# Patient Record
Sex: Male | Born: 1975 | Race: White | Hispanic: No | Marital: Single | Smoking: Never smoker
Health system: Southern US, Community
[De-identification: ages and names within clinical notes are randomized; demographics above are authoritative.]

## PROBLEM LIST (undated history)

## (undated) DIAGNOSIS — K8020 Calculus of gallbladder without cholecystitis without obstruction: Secondary | ICD-10-CM

## (undated) DIAGNOSIS — B019 Varicella without complication: Secondary | ICD-10-CM

## (undated) DIAGNOSIS — T7840XA Allergy, unspecified, initial encounter: Secondary | ICD-10-CM

## (undated) DIAGNOSIS — Q909 Down syndrome, unspecified: Secondary | ICD-10-CM

## (undated) DIAGNOSIS — Z923 Personal history of irradiation: Secondary | ICD-10-CM

## (undated) DIAGNOSIS — Z87448 Personal history of other diseases of urinary system: Secondary | ICD-10-CM

## (undated) DIAGNOSIS — C801 Malignant (primary) neoplasm, unspecified: Secondary | ICD-10-CM

## (undated) DIAGNOSIS — Z8489 Family history of other specified conditions: Secondary | ICD-10-CM

## (undated) HISTORY — DX: Down syndrome, unspecified: Q90.9

## (undated) HISTORY — DX: Allergy, unspecified, initial encounter: T78.40XA

## (undated) HISTORY — DX: Varicella without complication: B01.9

## (undated) HISTORY — DX: Calculus of gallbladder without cholecystitis without obstruction: K80.20

## (undated) HISTORY — PX: ROOT CANAL: SHX2363

---

## 2010-04-07 ENCOUNTER — Ambulatory Visit
Admission: RE | Admit: 2010-04-07 | Discharge: 2010-04-07 | Disposition: A | Discharge status: Home / Self Care | Payer: Medicare Other | Source: Ambulatory Visit | Attending: Emergency Medicine | Admitting: Emergency Medicine

## 2010-04-07 ENCOUNTER — Other Ambulatory Visit: Payer: Self-pay | Admitting: Emergency Medicine

## 2011-02-08 ENCOUNTER — Encounter (INDEPENDENT_AMBULATORY_CARE_PROVIDER_SITE_OTHER): Payer: Medicare Other | Admitting: Emergency Medicine

## 2011-02-08 DIAGNOSIS — Z23 Encounter for immunization: Secondary | ICD-10-CM

## 2011-02-08 DIAGNOSIS — Z0000 Encounter for general adult medical examination without abnormal findings: Secondary | ICD-10-CM

## 2011-02-08 DIAGNOSIS — H902 Conductive hearing loss, unspecified: Secondary | ICD-10-CM

## 2011-05-09 ENCOUNTER — Encounter: Payer: Self-pay | Admitting: *Deleted

## 2011-05-09 DIAGNOSIS — Q909 Down syndrome, unspecified: Secondary | ICD-10-CM

## 2011-05-09 DIAGNOSIS — R310 Gross hematuria: Secondary | ICD-10-CM | POA: Insufficient documentation

## 2011-05-10 ENCOUNTER — Encounter: Payer: Self-pay | Admitting: Emergency Medicine

## 2011-05-10 ENCOUNTER — Ambulatory Visit (INDEPENDENT_AMBULATORY_CARE_PROVIDER_SITE_OTHER): Payer: Medicare Other | Admitting: Emergency Medicine

## 2011-05-10 VITALS — BP 113/68 | HR 82 | Temp 98.6°F | Resp 16 | Ht 60.0 in | Wt 153.8 lb

## 2011-05-10 DIAGNOSIS — E782 Mixed hyperlipidemia: Secondary | ICD-10-CM

## 2011-05-10 DIAGNOSIS — E668 Other obesity: Secondary | ICD-10-CM

## 2011-05-10 DIAGNOSIS — E785 Hyperlipidemia, unspecified: Secondary | ICD-10-CM

## 2011-05-10 DIAGNOSIS — E236 Other disorders of pituitary gland: Secondary | ICD-10-CM

## 2011-05-10 DIAGNOSIS — Q909 Down syndrome, unspecified: Secondary | ICD-10-CM

## 2011-05-10 NOTE — Progress Notes (Signed)
  Subjective:    Patient ID: Zachary Wheeler, male    DOB: Oct 24, 1975, 36 y.o.   MRN: [id number]  HPI patient here to recheck his triglycerides and his testosterone level. 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 He really has no symptoms of low testosterone. He does not have any fatigue. We did not discuss the issue of erections in that may not be prudent to encourage him to be sexually active.    Review of Systems  Constitutional: Negative.   HENT: Negative.   Eyes: Negative.   Respiratory: Negative.   Cardiovascular: Negative.   Gastrointestinal: Negative.   Genitourinary:       Does have a history of urinary tract infections but is really had no symptoms recently.       Objective:   Physical Exam  Constitutional:       Patient has Down's appearance.  HENT:  Right Ear: External ear normal.  Left Ear: External ear normal.  Neck: No thyromegaly present.  Cardiovascular: Normal rate, regular rhythm and normal heart sounds.   Pulmonary/Chest: No respiratory distress. He has no wheezes.          Assessment & Plan:  We'll recheck his triglycerides and HDL today. He may be a candidate to try niacin. Will hold off on treatment of his low testosterone at the present time in that treatment may create more problems than it helps.

## 2011-05-11 LAB — CBC WITH DIFFERENTIAL/PLATELET
Eosinophils Absolute: 0.1 10*3/uL (ref 0.0–0.7)
Eosinophils Relative: 1 % (ref 0–5)
Hemoglobin: 15.5 g/dL (ref 13.0–17.0)
Lymphocytes Relative: 41 % (ref 12–46)
Lymphs Abs: 2 10*3/uL (ref 0.7–4.0)
MCH: 31.8 pg (ref 26.0–34.0)
MCV: 96.7 fL (ref 78.0–100.0)
Monocytes Relative: 12 % (ref 3–12)
Platelets: 219 10*3/uL (ref 150–400)
RBC: 4.88 MIL/uL (ref 4.22–5.81)
WBC: 4.8 10*3/uL (ref 4.0–10.5)

## 2011-05-11 LAB — TESTOSTERONE, FREE, TOTAL, SHBG
Testosterone, Free: 71.5 pg/mL (ref 47.0–244.0)
Testosterone-% Free: 2.4 % (ref 1.6–2.9)
Testosterone: 293.8 ng/dL (ref 250–890)

## 2011-05-11 LAB — LIPID PANEL
Cholesterol: 190 mg/dL (ref 0–200)
Total CHOL/HDL Ratio: 5 Ratio

## 2011-05-12 ENCOUNTER — Encounter: Payer: Self-pay | Admitting: Radiology

## 2011-09-26 ENCOUNTER — Ambulatory Visit (INDEPENDENT_AMBULATORY_CARE_PROVIDER_SITE_OTHER): Payer: MEDICARE | Admitting: Family Medicine

## 2011-09-26 VITALS — BP 112/64 | HR 72 | Temp 98.0°F | Resp 18 | Ht 60.0 in | Wt 153.0 lb

## 2011-09-26 DIAGNOSIS — J029 Acute pharyngitis, unspecified: Secondary | ICD-10-CM

## 2011-09-26 DIAGNOSIS — R059 Cough, unspecified: Secondary | ICD-10-CM

## 2011-09-26 DIAGNOSIS — R05 Cough: Secondary | ICD-10-CM

## 2011-09-26 DIAGNOSIS — J40 Bronchitis, not specified as acute or chronic: Secondary | ICD-10-CM

## 2011-09-26 DIAGNOSIS — R509 Fever, unspecified: Secondary | ICD-10-CM

## 2011-09-26 MED ORDER — AZITHROMYCIN 250 MG PO TABS: ORAL_TABLET | ORAL | Status: AC

## 2011-09-26 NOTE — Progress Notes (Signed)
@[email or web address] @   Patient ID: Zachary Wheeler MRN: [id number], DOB: [birth year], 36 y.o. Date of Encounter: 09/26/2011, 3:13 PM  Primary Physician: Lucilla Edin, MD  Chief Complaint:  Chief Complaint  Patient presents with  . Cough    since thursday  . Generalized Body Aches  . Sore Throat    HPI: 36 y.o. year old male presents with 4 day history of nasal congestion, post nasal drip, sore throat, sinus pressure, and cough. Afebrile. No chills. Nasal congestion thick and green/yellow. Sinus pressure is the worst symptom. Cough is productive secondary to post nasal drip and not associated with time of day. Ears feel full, leading to sensation of muffled hearing. Has tried OTC cold preps without success. No GI complaints. Appetite good  No recent antibiotics, recent travels, or sick contacts   No leg trauma, sedentary periods, h/o cancer, or tobacco use.  Past Medical History  Diagnosis Date  . Trisomy 21   . Gallstones   . Hematuria      Home Meds: Prior to Admission medications   Medication Sig Start Date End Date Taking? Authorizing Provider  ibuprofen (ADVIL,MOTRIN) 200 MG tablet Take 200 mg by mouth every 6 (six) hours as needed.   Yes Historical Provider, MD  pseudoephedrine-guaifenesin (MUCINEX D) 60-600 MG per tablet Take 1 tablet by mouth every 12 (twelve) hours.   Yes Historical Provider, MD  vitamin C (ASCORBIC ACID) 500 MG tablet Take 500 mg by mouth daily.    Historical Provider, MD    Allergies:  Allergies  Allergen Reactions  . Codeine   . Sulfa Antibiotics   . Terbinafine Hcl     History   Social History  . Marital Status: Single    Spouse Name: N/A    Number of Children: N/A  . Years of Education: 12   Occupational History  . janitor    Social History Main Topics  . Smoking status: Never Smoker   . Smokeless tobacco: Not on file  . Alcohol Use: Yes     1 beer on occas  . Drug Use: No  . Sexually Active: Not on file   Other Topics Concern  . Not on  file   Social History Narrative   Exercises regularly.     Review of Systems: Constitutional: negative for chills, fever, night sweats or weight changes Cardiovascular: negative for chest pain or palpitations Respiratory: negative for hemoptysis, wheezing, or shortness of breath Abdominal: negative for abdominal pain, nausea, vomiting or diarrhea Dermatological: negative for rash Neurologic: negative for headache   Physical Exam: Blood pressure 112/64, pulse 72, temperature 98 F (36.7 C), temperature source Oral, resp. rate 18, height 5' (1.524 m), weight 153 lb (69.4 kg), SpO2 97.00%., Body mass index is 29.88 kg/(m^2). General: Well developed, well nourished, in no acute distress. Head: Normocephalic, atraumatic, eyes without discharge, sclera non-icteric, nares are congested. Bilateral auditory canals clear, TM's are without perforation, pearly grey with reflective cone of light bilaterally. Serous effusion bilaterally behind TM's. Maxillary sinus TTP. Oral cavity moist, dentition normal. Posterior pharynx with post nasal drip and mild erythema. No peritonsillar abscess or tonsillar exudate. Neck: Supple. No thyromegaly. Full ROM. No lymphadenopathy. Lungs: Clear bilaterally to auscultation without wheezes, rales, or rhonchi. Breathing is unlabored.  Heart: RRR with S1 S2. No murmurs, rubs, or gallops appreciated. Msk:  Strength and tone normal for age. Extremities: No clubbing or cyanosis. No edema. Neuro: Alert and oriented X 3. Moves all extremities spontaneously. CNII-XII grossly in tact. Psych:  Responds to questions appropriately with a normal affect.   Labs:   ASSESSMENT AND PLAN:  36 y.o. year old male with bronchitis 1. Bronchitis  azithromycin (ZITHROMAX Z-PAK) 250 MG tablet    -  -Tylenol/Motrin prn -Rest/fluids -RTC precautions -RTC 3-5 days if no improvement  Signed, Elvina Sidle, MD 09/26/2011 3:13 PM  \

## 2011-11-10 ENCOUNTER — Ambulatory Visit (INDEPENDENT_AMBULATORY_CARE_PROVIDER_SITE_OTHER): Payer: MEDICARE

## 2011-11-10 DIAGNOSIS — Z23 Encounter for immunization: Secondary | ICD-10-CM

## 2011-11-10 NOTE — Progress Notes (Signed)
  Subjective:    Patient ID: Zachary Wheeler, male    DOB: [birth year], 36 y.o.   MRN: [id number]  HPI    Review of Systems     Objective:   Physical Exam        Assessment & Plan:  PT HERE FOR FLU SHOT ONLY

## 2011-11-22 ENCOUNTER — Ambulatory Visit (INDEPENDENT_AMBULATORY_CARE_PROVIDER_SITE_OTHER): Payer: MEDICARE | Admitting: Family Medicine

## 2011-11-22 VITALS — BP 108/64 | HR 88 | Temp 98.0°F | Resp 16 | Ht 60.0 in | Wt 151.4 lb

## 2011-11-22 DIAGNOSIS — H6690 Otitis media, unspecified, unspecified ear: Secondary | ICD-10-CM

## 2011-11-22 DIAGNOSIS — H6120 Impacted cerumen, unspecified ear: Secondary | ICD-10-CM

## 2011-11-22 MED ORDER — AMOXICILLIN-POT CLAVULANATE 875-125 MG PO TABS: 1.0000 | ORAL_TABLET | Freq: Two times a day (BID) | ORAL | Status: DC

## 2011-11-22 NOTE — Progress Notes (Signed)
  Subjective:    Patient ID: Zachary Wheeler, male    DOB: 1975/06/21, 36 y.o.   MRN: [id number]  HPI  Pt has had a persistent cough ever since 2 mos prev when he was treated w/ a zpack for bronchitis.  He felt much better after the abx but the cough never went away.  Productive of green sputum.  No f/c. No SHoB or CP.  Has been taking some mucinex to try to loosen up the congestion.  For the past few days his ears have been hurting w/ right > left.      Review of Systems  Constitutional: Negative for fever, chills, diaphoresis and fatigue.  HENT: Negative for congestion, sore throat, rhinorrhea, sneezing, neck stiffness, postnasal drip and sinus pressure.   Respiratory: Positive for cough. Negative for chest tightness, shortness of breath and wheezing.   Cardiovascular: Negative for chest pain.  Gastrointestinal: Negative for nausea and abdominal pain.  Skin: Negative for rash.  Neurological: Negative for dizziness, light-headedness and headaches.       Objective:   Physical Exam  Constitutional: He appears well-developed and well-nourished. No distress.  HENT:  Head: Normocephalic and atraumatic.  Right Ear: External ear normal. Tympanic membrane is retracted. Tympanic membrane is not perforated.  Left Ear: External ear normal. No drainage. Tympanic membrane is injected, erythematous and bulging. Tympanic membrane is not perforated. A middle ear effusion is present.  Nose: Nose normal.  Mouth/Throat: Oropharynx is clear and moist. No oropharyngeal exudate.       Cerumen removed with curette bilaterally necessary for visualization of TM.  Eyes: Conjunctivae normal are normal. No scleral icterus.  Neck: Normal range of motion. Neck supple. No thyromegaly present.  Cardiovascular: Normal rate, regular rhythm, normal heart sounds and intact distal pulses.   Pulmonary/Chest: Effort normal and breath sounds normal. No respiratory distress.  Lymphadenopathy:    He has no cervical adenopathy.    Neurological: He is alert.  Skin: Skin is warm and dry. He is not diaphoretic.  Psychiatric: He has a normal mood and affect.          Assessment & Plan:  1.  Lt OM - augmentin 2. Cough - hopefully will resolve w/ above trx. If cont, rtc for eval of allergies, asthma, lrd as cause.

## 2012-04-13 ENCOUNTER — Ambulatory Visit (INDEPENDENT_AMBULATORY_CARE_PROVIDER_SITE_OTHER): Payer: MEDICARE | Admitting: Internal Medicine

## 2012-04-13 VITALS — BP 118/74 | HR 94 | Temp 98.7°F | Resp 16 | Ht 60.0 in | Wt 157.2 lb

## 2012-04-13 DIAGNOSIS — J039 Acute tonsillitis, unspecified: Secondary | ICD-10-CM

## 2012-04-13 MED ORDER — AZITHROMYCIN 250 MG PO TABS: ORAL_TABLET | ORAL | Status: DC

## 2012-04-13 NOTE — Progress Notes (Signed)
  Subjective:    Patient ID: Zachary Wheeler, male    DOB: [birth year], 37 y.o.   MRN: [id number]  HPI Patient comes in today with with his mother for concerns that he has not been able to keep anything down for about a week. He has a sore throat that seems to be getting "clogged up". He then feels nauseated. He did have some ginger ale this around noon. He did not sleep at all last night. He has not had a fever. Last night he was laying on his stomach and flipped over to his back. He felt clammy and a burning sensation in his chest. Still has the burning sensation it is just mild now in comparison. He does complain of a headache but it is minor. He has some sinus pressure under his eyes.   Diarrhea has been a big concern also. He was using sugar free cough drops in excess for the past few weeks according to his mother. He was taking multiple over a few hour period. He has not had diarrhea since this morning.    Review of Systems     Objective:   Physical Exam BP 118/74  Pulse 94  Temp(Src) 98.7 F (37.1 C) (Oral)  Resp 16  Ht 5' (1.524 m)  Wt 157 lb 3.2 oz (71.305 kg)  BMI 30.7 kg/m2  SpO2 97% Narrow throat/obese neck Enlarged tonsils with exudate Tender 1+ a.c. Nodes Chest clear      Assessment & Plan:  Acute tonsillitis - Plan: Culture, Group A Strep, azithromycin (ZITHROMAX) 250 MG tablet

## 2012-04-15 LAB — CULTURE, GROUP A STREP: Organism ID, Bacteria: NORMAL

## 2012-11-13 ENCOUNTER — Ambulatory Visit (INDEPENDENT_AMBULATORY_CARE_PROVIDER_SITE_OTHER): Payer: MEDICARE | Admitting: *Deleted

## 2012-11-13 DIAGNOSIS — Z23 Encounter for immunization: Secondary | ICD-10-CM

## 2013-02-10 ENCOUNTER — Ambulatory Visit (INDEPENDENT_AMBULATORY_CARE_PROVIDER_SITE_OTHER): Payer: MEDICARE | Admitting: Emergency Medicine

## 2013-02-10 VITALS — BP 110/68 | HR 89 | Temp 98.2°F | Resp 18 | Ht 60.0 in | Wt 155.0 lb

## 2013-02-10 DIAGNOSIS — J029 Acute pharyngitis, unspecified: Secondary | ICD-10-CM

## 2013-02-10 DIAGNOSIS — M791 Myalgia, unspecified site: Secondary | ICD-10-CM

## 2013-02-10 DIAGNOSIS — R319 Hematuria, unspecified: Secondary | ICD-10-CM

## 2013-02-10 DIAGNOSIS — IMO0001 Reserved for inherently not codable concepts without codable children: Secondary | ICD-10-CM

## 2013-02-10 DIAGNOSIS — R05 Cough: Secondary | ICD-10-CM

## 2013-02-10 DIAGNOSIS — M549 Dorsalgia, unspecified: Secondary | ICD-10-CM

## 2013-02-10 DIAGNOSIS — R509 Fever, unspecified: Secondary | ICD-10-CM

## 2013-02-10 LAB — POCT UA - MICROSCOPIC ONLY
Casts, Ur, LPF, POC: NEGATIVE
Yeast, UA: NEGATIVE

## 2013-02-10 LAB — POCT URINALYSIS DIPSTICK
Leukocytes, UA: NEGATIVE
Protein, UA: 30
Spec Grav, UA: 1.02
Urobilinogen, UA: 0.2

## 2013-02-10 LAB — POCT INFLUENZA A/B: Influenza A, POC: NEGATIVE

## 2013-02-10 MED ORDER — BENZONATATE 100 MG PO CAPS: 100.0000 mg | ORAL_CAPSULE | Freq: Three times a day (TID) | ORAL | Status: DC | PRN

## 2013-02-10 NOTE — Patient Instructions (Signed)
Recheck Fri 02/15/2013 (fast pass after triage) to see Dr. Cleta Alberts, recheck urine for hematuria

## 2013-02-10 NOTE — Progress Notes (Addendum)
Subjective:    Patient ID: Zachary Wheeler, male    DOB: February 19, 1976, 37 y.o.   MRN: [id number] This chart was scribed for Lesle Chris, MD by Nicholos Johns, Medical Scribe. This patient's care was started at 8:29 AM.  HPI  HPI Comments: Zachary Wheeler is a 37 y.o. male who presents to the [facility] complaining of gradually worsening, severe back pain, onset 6 days ago. Patient states he has since started experiencing aching from head to toe with associated subjective fever, sore throat, cough, and headache. He states the sore throat has resolved slightly but may return upon coughing. Cough is still present. Pt denies any urinary trouble since his last episode 3 years ago.  Review of Systems  Constitutional: Positive for fever.  HENT: Positive for sore throat.   Respiratory: Positive for cough.   Neurological: Positive for headaches.       Objective:   Physical Exam  Vitals reviewed.  CONSTITUTIONAL: Well developed/well nourished HEAD AND FACE: Normocephalic/atraumatic EYES: EOMI ENMT: Mucous membranes moist patient does have significant nasal congestion. His throat is slightly red NECK: supple no meningeal signs SPINE:entire spine nontender CV: S1/S2 noted, no murmurs/rubs/gallops noted LUNGS: Lungs are clear to auscultation bilaterally, no apparent distress no rales or wheezes were heard. ABDOMEN: soft, nontender, no rebound or guarding GU:no cva tenderness NEURO: Pt is awake/alert, moves all extremitiesx4 EXTREMITIES: pulses normal, full ROM SKIN: warm, color normal PSYCH: no abnormalities of mood noted    Filed Vitals:   02/10/13 0819  BP: 110/68  Pulse: 89  Temp: 98.2 F (36.8 C)  TempSrc: Oral  Resp: 18  Height: 5' (1.524 m)  Weight: 155 lb (70.308 kg)  SpO2: 97%   Results for orders placed in visit on 02/10/13  POCT URINALYSIS DIPSTICK      Result Value Range   Color, UA yellow     Clarity, UA clear     Glucose, UA neg     Bilirubin,  UA neg     Ketones, UA neg     Spec Grav, UA 1.020     Blood, UA moderate     pH, UA 6.0     Protein, UA 30     Urobilinogen, UA 0.2     Nitrite, UA neg     Leukocytes, UA Negative    POCT UA - MICROSCOPIC ONLY      Result Value Range   WBC, Ur, HPF, POC 0-2     RBC, urine, microscopic 10-15     Bacteria, U Microscopic trace     Mucus, UA neg     Epithelial cells, urine per micros 0-1     Crystals, Ur, HPF, POC neg     Casts, Ur, LPF, POC neg     Yeast, UA neg    POCT INFLUENZA A/B      Result Value Range   Influenza A, POC Negative     Influenza B, POC Negative    POCT RAPID STREP A (OFFICE)      Result Value Range   Rapid Strep A Screen Negative  Negative        Assessment & Plan:  Testing is all negative. We'll treat with fluids, Tylenol, Tessalon Perles . 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     I personally performed the  services described in this documentation, which was scribed in my presence. The recorded information has been reviewed and is accurate.

## 2013-02-11 LAB — URINE CULTURE: Colony Count: NO GROWTH

## 2013-02-16 ENCOUNTER — Ambulatory Visit (INDEPENDENT_AMBULATORY_CARE_PROVIDER_SITE_OTHER): Payer: MEDICARE | Admitting: Emergency Medicine

## 2013-02-16 VITALS — BP 112/62 | HR 76 | Temp 97.6°F | Resp 16 | Ht 61.0 in | Wt 155.0 lb

## 2013-02-16 DIAGNOSIS — R319 Hematuria, unspecified: Secondary | ICD-10-CM

## 2013-02-16 DIAGNOSIS — R05 Cough: Secondary | ICD-10-CM

## 2013-02-16 DIAGNOSIS — R059 Cough, unspecified: Secondary | ICD-10-CM

## 2013-02-16 LAB — POCT URINALYSIS DIPSTICK
Glucose, UA: NEGATIVE
Leukocytes, UA: NEGATIVE
Protein, UA: 30
Spec Grav, UA: 1.02
Urobilinogen, UA: 0.2
pH, UA: 6

## 2013-02-16 LAB — POCT UA - MICROSCOPIC ONLY
Casts, Ur, LPF, POC: NEGATIVE
Crystals, Ur, HPF, POC: NEGATIVE

## 2013-02-16 NOTE — Progress Notes (Signed)
Subjective:    Patient ID: Zachary Wheeler, male    DOB: 1976-01-07, 37 y.o.   MRN: [id number]  HPI  This chart was scribed for Collene Gobble, MD, by Ellin Mayhew, ED Scribe. This patient was seen in room 04 and the patient's care was started at 8:56 AM.  HPI Comments: Zachary Wheeler is a 37 y.o. male who presents to the [facility] for a follow up appointment regarding a productive cough that began six days ago. Patient reports feeling better; however, is still producing phlegm upon coughing. He was last seen six days ago by Dr. Cleta Alberts at the [facility] center for his cough. He has taken ibuprofen and mucinex, with no relief of his cough or congestion. He denies any related myalgias and fever that accompanied his cough six days ago. Patient appears generally healthy and denies any other issues currently.   Past Medical History  Diagnosis Date  . Trisomy 21   . Gallstones   . Hematuria   . Allergy   . Chronic kidney disease   . Down syndrome     Past Surgical History  Procedure Laterality Date  . Root canal      Family History  Problem Relation Age of Onset  . Hypertension Mother   . Hyperlipidemia Mother   . Hypertension Maternal Grandmother   . Emphysema Maternal Grandmother   . Hypertension Maternal Grandfather   . Alcohol abuse Maternal Grandfather   . Cancer Paternal Grandmother   . Congestive Heart Failure Paternal Grandfather     History   Social History  . Marital Status: Single    Spouse Name: N/A    Number of Children: N/A  . Years of Education: 12   Occupational History  . janitor    Social History Main Topics  . Smoking status: Never Smoker   . Smokeless tobacco: Not on file  . Alcohol Use: Yes     Comment: 1 beer on occas  . Drug Use: No  . Sexual Activity: Not on file   Other Topics Concern  . Not on file   Social History Narrative   Exercises regularly.    Allergies  Allergen Reactions  . Codeine   .  Sulfa Antibiotics   . Terbinafine Hcl     Patient Active Problem List   Diagnosis Date Noted  . Trisomy 21   . Hematuria     Results for orders placed in visit on 02/10/13  URINE CULTURE      Result Value Range   Colony Count NO GROWTH     Organism ID, Bacteria NO GROWTH    POCT URINALYSIS DIPSTICK      Result Value Range   Color, UA yellow     Clarity, UA clear     Glucose, UA neg     Bilirubin, UA neg     Ketones, UA neg     Spec Grav, UA 1.020     Blood, UA moderate     pH, UA 6.0     Protein, UA 30     Urobilinogen, UA 0.2     Nitrite, UA neg     Leukocytes, UA Negative    POCT UA - MICROSCOPIC ONLY      Result Value Range   WBC, Ur, HPF, POC 0-2     RBC, urine, microscopic 10-15     Bacteria, U Microscopic trace     Mucus, UA neg  Epithelial cells, urine per micros 0-1     Crystals, Ur, HPF, POC neg     Casts, Ur, LPF, POC neg     Yeast, UA neg    POCT INFLUENZA A/B      Result Value Range   Influenza A, POC Negative     Influenza B, POC Negative    POCT RAPID STREP A (OFFICE)      Result Value Range   Rapid Strep A Screen Negative  Negative    1. Hematuria     No orders of the defined types were placed in this encounter.    BP 112/62  Pulse 76  Temp(Src) 97.6 F (36.4 C) (Oral)  Resp 16  Ht 5\' 1"  (1.549 m)  Wt 155 lb (70.308 kg)  BMI 29.30 kg/m2  SpO2 99%   Review of Systems  Constitutional: Negative for fever, chills, activity change and appetite change.  HENT: Positive for congestion. Negative for ear pain and sore throat.   Respiratory: Positive for cough. Negative for shortness of breath and wheezing.   Gastrointestinal: Negative for nausea, vomiting, abdominal pain and diarrhea.  Skin: Negative for color change and rash.  Neurological: Negative for dizziness and headaches.   A complete 10 system review of systems was obtained and all systems are negative except as noted in the HPI and PMH.    Objective:   Physical  Exam  CONSTITUTIONAL: Well nourished. NAD. HEAD: Atraumatic. EYES: EOMI/PERRL ENMT: TMs are clear nose is normal throat is normal NECK: supple no meningeal signs SPINE:entire spine nontender CV: S1/S2 noted, no murmurs/rubs/gallops noted LUNGS: Lungs are clear to auscultation bilaterally, no apparent distress ABDOMEN: soft, nontender, no rebound or guarding GU:no cva tenderness NEURO: Pt is awake/alert, moves all extremitiesx4 EXTREMITIES: pulses normal, full ROM SKIN: warm, color normal PSYCH: no abnormalities of mood noted Results for orders placed in visit on 02/16/13  POCT UA - MICROSCOPIC ONLY      Result Value Range   WBC, Ur, HPF, POC 0-2     RBC, urine, microscopic 8-12     Bacteria, U Microscopic small     Mucus, UA neg     Epithelial cells, urine per micros 0-2     Crystals, Ur, HPF, POC neg     Casts, Ur, LPF, POC neg     Yeast, UA neg    POCT URINALYSIS DIPSTICK      Result Value Range   Color, UA yellow     Clarity, UA clear     Glucose, UA neg     Bilirubin, UA neg     Ketones, UA neg     Spec Grav, UA 1.020     Blood, UA moderate     pH, UA 6.0     Protein, UA 30     Urobilinogen, UA 0.2     Nitrite, UA neg     Leukocytes, UA Negative     Assessment & Plan:  Go ahead and treat his cough with Tessalon Perles and Mucinex DM. He has persistent hematuria we'll repeat this in one month he has had a thorough urological workup in the past

## 2013-03-02 ENCOUNTER — Ambulatory Visit (INDEPENDENT_AMBULATORY_CARE_PROVIDER_SITE_OTHER): Payer: MEDICARE | Admitting: Family Medicine

## 2013-03-02 VITALS — BP 128/76 | HR 77 | Temp 98.1°F | Resp 12 | Ht 60.25 in | Wt 154.0 lb

## 2013-03-02 DIAGNOSIS — H109 Unspecified conjunctivitis: Secondary | ICD-10-CM

## 2013-03-02 MED ORDER — CIPROFLOXACIN HCL 0.3 % OP SOLN: OPHTHALMIC | Status: DC

## 2013-03-02 NOTE — Patient Instructions (Signed)
This is a severe case of pink eye.  If it is worsening at all, make sure you let us know - we would switch you back to the tobradex drops (and call in a rx that hasn't expired yet) and have your rechecked with your eye doctor.  I suspect that the cipro drops and lots of warm wet compresses should take care of this completely however.  Conjunctivitis Conjunctivitis is commonly called "pink eye." Conjunctivitis can be caused by bacterial or viral infection, allergies, or injuries. There is usually redness of the lining of the eye, itching, discomfort, and sometimes discharge. There may be deposits of matter along the eyelids. A viral infection usually causes a watery discharge, while a bacterial infection causes a yellowish, thick discharge. Pink eye is very contagious and spreads by direct contact. You may be given antibiotic eyedrops as part of your treatment. Before using your eye medicine, remove all drainage from the eye by washing gently with warm water and cotton balls. Continue to use the medication until you have awakened 2 mornings in a row without discharge from the eye. Do not rub your eye. This increases the irritation and helps spread infection. Use separate towels from other household members. Wash your hands with soap and water before and after touching your eyes. Use cold compresses to reduce pain and sunglasses to relieve irritation from light. Do not wear contact lenses or wear eye makeup until the infection is gone. SEEK MEDICAL CARE IF:   Your symptoms are not better after 3 days of treatment.  You have increased pain or trouble seeing.  The outer eyelids become very red or swollen. Document Released: 03/17/2004 Document Revised: 05/02/2011 Document Reviewed: 02/07/2005 [facility] Patient Information 2014 [place].

## 2013-03-02 NOTE — Progress Notes (Addendum)
Subjective:    Patient ID: Zachary Wheeler, male    DOB: 18-Jul-1975, 38 y.o.   MRN: [id number]  HPI Chief Complaint  Patient presents with  . Eye Drainage    seen by [facility] Fri am -given meds-meds not working  . Facial Swelling    R/eye red, swollen x 1 day   This chart was scribed for Ludger Nutting, MD by Thea Alken, ED Scribe. This patient was seen in room 14 and the patient's care was started at 9:45 AM.  HPI Comments: Zachary Wheeler is a 38 y.o. male who presents to the [facility] with his father complaining of worsening right eye drainage. It initially started 2d prior and they had some tobradex gtts at home which he started in his eye. Then yesterday pt was seen at [place] [facility] by his optometrist and told he had contact conjunctivitis. Was told to stop the tobradex as it was expired and started on pataday gtts but since then his sxs have progressively worsened. He reports that there was no pus in his eye when he woke up this morning but there has been a lot of crusting and watery discharge. He reports minor pain with movement to his right eye and it is very red and swollen. Pt does not wear contacts ever but wears glasses regularly. Pt denies photophobia, and blurry vision to right eye. No vision changes at all. He denies any scratches or recent injury to right eye.  Past Medical History  Diagnosis Date  . Trisomy 21   . Gallstones   . Hematuria   . Allergy   . Chronic kidney disease   . Down syndrome    Allergies  Allergen Reactions  . Codeine   . Sulfa Antibiotics   . Terbinafine Hcl    Prior to Admission medications   Medication Sig Start Date End Date Taking? Authorizing Provider  azithromycin (ZITHROMAX) 250 MG tablet As packaged 04/13/12   Leandrew Koyanagi, MD  benzonatate (TESSALON) 100 MG capsule Take 1-2 capsules (100-200 mg total) by mouth 3 (three) times daily as needed for cough. 02/10/13   Darlyne Russian, MD  ibuprofen (ADVIL,MOTRIN) 200  MG tablet Take 200 mg by mouth every 6 (six) hours as needed.    Historical Provider, MD  vitamin C (ASCORBIC ACID) 500 MG tablet Take 500 mg by mouth daily.    Historical Provider, MD       Review of Systems  Constitutional: Negative for fever, chills, diaphoresis and fatigue.  HENT: Positive for facial swelling. Negative for congestion, ear discharge, ear pain, postnasal drip, rhinorrhea, sinus pressure, sneezing and sore throat.   Eyes: Positive for pain, discharge ( right) and redness. Negative for photophobia, itching and visual disturbance.  Skin: Positive for color change. Negative for rash.  Neurological: Negative for headaches.  Hematological: Negative for adenopathy.  Psychiatric/Behavioral: Negative for sleep disturbance.      BP 128/76  Pulse 77  Temp(Src) 98.1 F (36.7 C) (Oral)  Resp 12  Ht 5' 0.25" (1.53 m)  Wt 154 lb (69.854 kg)  BMI 29.84 kg/m2  SpO2 99% Objective:   Physical Exam  Nursing note and vitals reviewed. Constitutional: He is oriented to person, place, and time. He appears well-developed and well-nourished. No distress.  HENT:  Head: Normocephalic and atraumatic.  Eyes: EOM are normal. Pupils are equal, round, and reactive to light. Right eye exhibits chemosis, discharge and exudate. Left eye exhibits no chemosis, no discharge  and no exudate. Right conjunctiva is injected (severe). Right conjunctiva has no hemorrhage. Left conjunctiva is not injected. Left conjunctiva has no hemorrhage.  Fundoscopic exam:      The right eye shows no exudate, no hemorrhage and no papilledema.       The left eye shows no exudate, no hemorrhage and no papilledema.  Neck: Neck supple. No tracheal deviation present.  Cardiovascular: Normal rate.   Pulmonary/Chest: Effort normal. No respiratory distress.  Musculoskeletal: Normal range of motion.  Neurological: He is alert and oriented to person, place, and time.  Skin: Skin is warm and dry.  Psychiatric: He has a normal  mood and affect. His behavior is normal.    Visual Acuity Screening   Right eye Left eye Both eyes  Without correction:     With correction: 20/40 20/40 20/30         Assessment & Plan:  Conjunctivitis - try below, if cont to worsen could switch back to tobradex and f/u w/ optho.  Meds ordered this encounter  Medications  . ciprofloxacin (CILOXAN) 0.3 % ophthalmic solution    Sig: Administer 2 drops to the right eye, every 2 hours, while awake, for 2 days. Then 2 drop, every 4 hours, while awake, for the next 5 days.    Dispense:  5 mL    Refill:  0    I personally performed the services described in this documentation, which was scribed in my presence. The recorded information has been reviewed and considered, and addended by me as needed.  Delman Cheadle, MD MPH

## 2013-04-07 ENCOUNTER — Ambulatory Visit (INDEPENDENT_AMBULATORY_CARE_PROVIDER_SITE_OTHER): Payer: MEDICARE | Admitting: Emergency Medicine

## 2013-04-07 VITALS — BP 116/72 | HR 68 | Temp 98.0°F | Resp 16 | Ht 60.25 in | Wt 155.4 lb

## 2013-04-07 DIAGNOSIS — H1030 Unspecified acute conjunctivitis, unspecified eye: Secondary | ICD-10-CM

## 2013-04-07 MED ORDER — TOBRAMYCIN 0.3 % OP SOLN: 2.0000 [drp] | OPHTHALMIC | Status: DC

## 2013-04-07 NOTE — Progress Notes (Signed)
[facility] [address] [place] [address] [phone]  Date:  04/07/2013   Name:  Zachary Wheeler   DOB:  May 24, 1975   MRN:  [id number]  PCP:  Jenny Reichmann, MD    Chief Complaint: Conjunctivitis   History of Present Illness:  Zachary Wheeler is a 38 y.o. very pleasant male patient who presents with the following:  Seen on 1/10 for right conjunctivitis.  Now has symptoms in left eye.  Started yesterday with redness, burning and watering.  No fever or chills, no antecedent illness, cough or coryza.  No visual symptoms.  No improvement with over the counter medications or other home remedies. Denies other complaint or health concern today.   Patient Active Problem List   Diagnosis Date Noted  . Trisomy 21   . Hematuria     Past Medical History  Diagnosis Date  . Trisomy 21   . Gallstones   . Hematuria   . Allergy   . Chronic kidney disease   . Down syndrome     Past Surgical History  Procedure Laterality Date  . Root canal      History  Substance Use Topics  . Smoking status: Never Smoker   . Smokeless tobacco: Not on file  . Alcohol Use: Yes     Comment: 1 beer on occas    Family History  Problem Relation Age of Onset  . Hypertension Mother   . Hyperlipidemia Mother   . Hypertension Maternal Grandmother   . Emphysema Maternal Grandmother   . Hypertension Maternal Grandfather   . Alcohol abuse Maternal Grandfather   . Cancer Paternal Grandmother   . Congestive Heart Failure Paternal Grandfather     Allergies  Allergen Reactions  . Codeine   . Sulfa Antibiotics   . Terbinafine Hcl     Medication list has been reviewed and updated.  Current Outpatient Prescriptions on File Prior to Visit  Medication Sig Dispense Refill  . ciprofloxacin (CILOXAN) 0.3 % ophthalmic solution Administer 2 drops to the right eye, every 2 hours, while awake, for 2 days. Then 2 drop, every 4 hours, while awake, for the next 5 days.  5 mL  0  . ibuprofen  (ADVIL,MOTRIN) 200 MG tablet Take 200 mg by mouth every 6 (six) hours as needed.      . vitamin C (ASCORBIC ACID) 500 MG tablet Take 500 mg by mouth daily.       No current facility-administered medications on file prior to visit.    Review of Systems:  As per HPI, otherwise negative.    Physical Examination: Filed Vitals:   04/07/13 0820  BP: 116/72  Pulse: 68  Temp: 98 F (36.7 C)  Resp: 16   Filed Vitals:   04/07/13 0820  Height: 5' 0.25" (1.53 m)  Weight: 155 lb 6.4 oz (70.489 kg)   Body mass index is 30.11 kg/(m^2). Ideal Body Weight: Weight in (lb) to have BMI = 25: 128.8   GEN: WDWN, NAD, Non-toxic, Alert & Oriented x 3 HEENT: Atraumatic, Normocephalic.  Ears and Nose: No external deformity. EXTR: No clubbing/cyanosis/edema NEURO: Normal gait.  PSYCH: Normally interactive. Conversant. Not depressed or anxious appearing.  Calm demeanor.  Right eye:  Conjunctiva and sclera clear, no injection LEFT eye: injected, cornea clear, no FB.  Marginal lid exudate  Assessment and Plan: Conjunctivitis tobrex   Signed,  Ellison Carwin, MD

## 2013-04-07 NOTE — Patient Instructions (Signed)

## 2013-04-08 ENCOUNTER — Ambulatory Visit (INDEPENDENT_AMBULATORY_CARE_PROVIDER_SITE_OTHER): Payer: MEDICARE | Admitting: Emergency Medicine

## 2013-04-08 VITALS — BP 122/72 | HR 65 | Temp 98.1°F | Resp 16 | Ht 60.5 in | Wt 154.2 lb

## 2013-04-08 DIAGNOSIS — H1030 Unspecified acute conjunctivitis, unspecified eye: Secondary | ICD-10-CM

## 2013-04-08 DIAGNOSIS — T7840XA Allergy, unspecified, initial encounter: Secondary | ICD-10-CM

## 2013-04-08 NOTE — Progress Notes (Signed)
   Subjective:    Patient ID: Zachary Wheeler, male    DOB: Sep 15, 1975, 38 y.o.   MRN: [id number]  HPI 38 y.o. Male presents for follow up of conjunctivitis. Is having some pain in the left eye. Reports that eye drops prescribed seemed to make problem worse. Patient had an episode conjunctivitis one month ago and was treated with Ocuflox. He was seen yesterday and put on tobramycin and now has severe swelling around the conjunctiva of his left eye.   Review of Systems     Objective:   Physical Exam patient is alert and cooperative. There is significant swelling of the scleral conjunctiva. The cornea itself appears normal. There no preauricular nodes present        Assessment & Plan:  I suspect this is an allergic reaction to tobramycin or one of the preservatives in that preparation. Patient will see Dr. Carolynn Sayers at 10:45 for his evaluation

## 2013-04-08 NOTE — Patient Instructions (Signed)
Please go to [facility]. [place] [address]   Appt. Is at 10:45 today

## 2013-11-12 ENCOUNTER — Ambulatory Visit (INDEPENDENT_AMBULATORY_CARE_PROVIDER_SITE_OTHER): Payer: MEDICARE

## 2013-11-12 DIAGNOSIS — Z23 Encounter for immunization: Secondary | ICD-10-CM

## 2013-11-22 ENCOUNTER — Ambulatory Visit (INDEPENDENT_AMBULATORY_CARE_PROVIDER_SITE_OTHER): Payer: MEDICARE

## 2013-11-22 ENCOUNTER — Ambulatory Visit
Admission: RE | Admit: 2013-11-22 | Discharge: 2013-11-22 | Disposition: A | Discharge status: Home / Self Care | Payer: Medicare Other | Source: Ambulatory Visit | Attending: Emergency Medicine | Admitting: Emergency Medicine

## 2013-11-22 ENCOUNTER — Ambulatory Visit (INDEPENDENT_AMBULATORY_CARE_PROVIDER_SITE_OTHER): Payer: MEDICARE | Admitting: Emergency Medicine

## 2013-11-22 VITALS — BP 122/84 | HR 67 | Temp 97.4°F | Resp 18 | Ht 60.0 in | Wt 156.8 lb

## 2013-11-22 DIAGNOSIS — N508 Other specified disorders of male genital organs: Secondary | ICD-10-CM

## 2013-11-22 DIAGNOSIS — N5089 Other specified disorders of the male genital organs: Secondary | ICD-10-CM

## 2013-11-22 LAB — COMPLETE METABOLIC PANEL WITH GFR
ALT: 18 U/L (ref 0–53)
AST: 18 U/L (ref 0–37)
Albumin: 4.3 g/dL (ref 3.5–5.2)
Alkaline Phosphatase: 77 U/L (ref 39–117)
BUN: 11 mg/dL (ref 6–23)
CHLORIDE: 103 meq/L (ref 96–112)
CO2: 27 meq/L (ref 19–32)
Calcium: 9.5 mg/dL (ref 8.4–10.5)
Creat: 0.97 mg/dL (ref 0.50–1.35)
GFR, Est African American: >89 mL/min
GFR, Est Non African American: >89 mL/min
Glucose, Bld: 101 mg/dL — ABNORMAL HIGH (ref 70–99)
Potassium: 4.3 mEq/L (ref 3.5–5.3)
Sodium: 139 mEq/L (ref 135–145)
Total Bilirubin: 0.6 mg/dL (ref 0.2–1.2)
Total Protein: 7.2 g/dL (ref 6.0–8.3)

## 2013-11-22 LAB — POCT CBC
GRANULOCYTE PERCENT: 55.6 % (ref 37–80)
HCT, POC: 49.3 % (ref 43.5–53.7)
HEMOGLOBIN: 16.1 g/dL (ref 14.1–18.1)
Lymph, poc: 1.9 (ref 0.6–3.4)
MCH, POC: 31.5 pg — AB (ref 27–31.2)
MCHC: 32.7 g/dL (ref 31.8–35.4)
MCV: 96.4 fL (ref 80–97)
MID (cbc): 0.5 (ref 0–0.9)
MPV: 7.5 fL (ref 0–99.8)
POC GRANULOCYTE: 2.9 (ref 2–6.9)
POC LYMPH PERCENT: 35.7 %L (ref 10–50)
POC MID %: 8.7 % (ref 0–12)
Platelet Count, POC: 200 10*3/uL (ref 142–424)
RBC: 5.12 M/uL (ref 4.69–6.13)
RDW, POC: 13.4 %
WBC: 5.3 10*3/uL (ref 4.6–10.2)

## 2013-11-22 LAB — LACTATE DEHYDROGENASE: LDH: 174 U/L (ref 94–250)

## 2013-11-22 NOTE — Patient Instructions (Signed)
Please go to [facility] at 1:45PM today for an Ultrasound. [place]  We have also placed a call to Dr. Simone Curia office for an appt. They will contact us as soon as we have information.

## 2013-11-22 NOTE — Progress Notes (Signed)
   Subjective:    Patient ID: Zachary Wheeler, male    DOB: 04-06-1975, 38 y.o.   MRN: [id number]  HPI patient states that in July he noticed a firm area on his right testicle. He has treated this with Neosporin. He enters today because of persistent swelling in that area. He denies any nodes in his groin area denies any other symptoms. He does have a history of an abnormal bladder on CT and had seen Dr. Karsten Ro in February of 2012.   Review of Systems     Objective:   Physical Exam patient is alert and cooperative he is not in distress. His neck is supple. Chest clear. Abdomen soft nontender. The right testicle is twice as large as the left testicle. It is rock hard and irregular. Results for orders placed in visit on 11/22/13  POCT CBC      Result Value Ref Range   WBC 5.3  4.6 - 10.2 K/uL   Lymph, poc 1.9  0.6 - 3.4   POC LYMPH PERCENT 35.7  10 - 50 %L   MID (cbc) 0.5  0 - 0.9   POC MID % 8.7  0 - 12 %M   POC Granulocyte 2.9  2 - 6.9   Granulocyte percent 55.6  37 - 80 %G   RBC 5.12  4.69 - 6.13 M/uL   Hemoglobin 16.1  14.1 - 18.1 g/dL   HCT, POC 49.3  43.5 - 53.7 %   MCV 96.4  80 - 97 fL   MCH, POC 31.5 (*) 27 - 31.2 pg   MCHC 32.7  31.8 - 35.4 g/dL   RDW, POC 13.4     Platelet Count, POC 200  142 - 424 K/uL   MPV 7.5  0 - 99.8 fL   [facility] reading (PRIMARY) by  Dr. Everlene Farrier no acute disease       Assessment & Plan:  Physical exam very suspicious for testicular cancer. Check a chest x-ray as well as alpha-fetoprotein, serum hCG, LDH, and Cmet . Call placed to urology. Ultrasound scheduled of the right testicle. Of note and physical examination December 2012 the right testicle was somewhat atrophic. Ultrasound did confirm a suspicious mass involving the right testicle which seemed to be originating from the base. I discussed the situation with Dr. Diona Fanti. They will give patient an appointment to be seen the first of next week.

## 2013-11-23 LAB — HCG, QUANTITATIVE, PREGNANCY

## 2013-11-23 LAB — AFP TUMOR MARKER: AFP TUMOR MARKER: 3.3 ng/mL (ref <6.1)

## 2013-11-25 ENCOUNTER — Telehealth: Payer: Self-pay | Admitting: *Deleted

## 2013-11-25 ENCOUNTER — Encounter (HOSPITAL_BASED_OUTPATIENT_CLINIC_OR_DEPARTMENT_OTHER): Payer: Self-pay | Admitting: *Deleted

## 2013-11-25 ENCOUNTER — Other Ambulatory Visit: Payer: Self-pay | Admitting: Urology

## 2013-11-25 NOTE — Telephone Encounter (Signed)
Pt and family are going to Dr. Guadalupe Maple today 1230pm Spoke to pt father.

## 2013-11-27 ENCOUNTER — Encounter (HOSPITAL_BASED_OUTPATIENT_CLINIC_OR_DEPARTMENT_OTHER): Payer: Self-pay | Admitting: *Deleted

## 2013-11-27 NOTE — Progress Notes (Signed)
Hx obtained from father, Zachary Wheeler.  Instructions given for pt to be npo p mn 10/11.  To [facility] 10/12 @ 0815.  Labs w/ chart. Father states pt "is his own man". Very independent

## 2013-12-02 ENCOUNTER — Encounter (HOSPITAL_BASED_OUTPATIENT_CLINIC_OR_DEPARTMENT_OTHER): Payer: Medicare Other | Admitting: Certified Registered"

## 2013-12-02 ENCOUNTER — Ambulatory Visit (HOSPITAL_BASED_OUTPATIENT_CLINIC_OR_DEPARTMENT_OTHER): Payer: Medicare Other | Admitting: Certified Registered"

## 2013-12-02 ENCOUNTER — Ambulatory Visit (HOSPITAL_BASED_OUTPATIENT_CLINIC_OR_DEPARTMENT_OTHER)
Admission: RE | Admit: 2013-12-02 | Discharge: 2013-12-02 | Disposition: A | Discharge status: Home / Self Care | Payer: Medicare Other | Source: Ambulatory Visit | Attending: Urology | Admitting: Urology

## 2013-12-02 ENCOUNTER — Encounter (HOSPITAL_BASED_OUTPATIENT_CLINIC_OR_DEPARTMENT_OTHER): Payer: Self-pay

## 2013-12-02 ENCOUNTER — Encounter (HOSPITAL_BASED_OUTPATIENT_CLINIC_OR_DEPARTMENT_OTHER)
Admission: RE | Disposition: A | Discharge status: Home / Self Care | Payer: Self-pay | Source: Ambulatory Visit | Attending: Urology

## 2013-12-02 DIAGNOSIS — Q909 Down syndrome, unspecified: Secondary | ICD-10-CM | POA: Insufficient documentation

## 2013-12-02 DIAGNOSIS — C801 Malignant (primary) neoplasm, unspecified: Secondary | ICD-10-CM

## 2013-12-02 DIAGNOSIS — C6211 Malignant neoplasm of descended right testis: Secondary | ICD-10-CM | POA: Insufficient documentation

## 2013-12-02 DIAGNOSIS — N508 Other specified disorders of male genital organs: Secondary | ICD-10-CM | POA: Diagnosis present

## 2013-12-02 DIAGNOSIS — Z882 Allergy status to sulfonamides status: Secondary | ICD-10-CM | POA: Diagnosis not present

## 2013-12-02 DIAGNOSIS — Z885 Allergy status to narcotic agent status: Secondary | ICD-10-CM | POA: Insufficient documentation

## 2013-12-02 DIAGNOSIS — C6291 Malignant neoplasm of right testis, unspecified whether descended or undescended: Secondary | ICD-10-CM

## 2013-12-02 HISTORY — PX: ORCHIECTOMY: SHX2116

## 2013-12-02 HISTORY — DX: Family history of other specified conditions: Z84.89

## 2013-12-02 HISTORY — DX: Personal history of other diseases of urinary system: Z87.448

## 2013-12-02 HISTORY — DX: Malignant (primary) neoplasm, unspecified: C80.1

## 2013-12-02 LAB — POCT HEMOGLOBIN-HEMACUE: Hemoglobin: 16.2 g/dL (ref 13.0–17.0)

## 2013-12-02 SURGERY — ORCHIECTOMY
Anesthesia: General | Site: Groin | Laterality: Right

## 2013-12-02 MED ORDER — MIDAZOLAM HCL 2 MG/2ML IJ SOLN
INTRAMUSCULAR | Status: AC
  Filled 2013-12-02: qty 2

## 2013-12-02 MED ORDER — MIDAZOLAM HCL 5 MG/5ML IJ SOLN
INTRAMUSCULAR | Status: DC | PRN
  Administered 2013-12-02: 2 mg via INTRAVENOUS

## 2013-12-02 MED ORDER — KETOROLAC TROMETHAMINE 30 MG/ML IJ SOLN
15.0000 mg | Freq: Once | INTRAMUSCULAR | Status: DC | PRN
  Filled 2013-12-02: qty 1

## 2013-12-02 MED ORDER — CEFAZOLIN SODIUM-DEXTROSE 2-3 GM-% IV SOLR
INTRAVENOUS | Status: AC
  Filled 2013-12-02: qty 50

## 2013-12-02 MED ORDER — LIDOCAINE HCL (CARDIAC) 20 MG/ML IV SOLN
INTRAVENOUS | Status: DC | PRN
  Administered 2013-12-02: 50 mg via INTRAVENOUS

## 2013-12-02 MED ORDER — TRAMADOL HCL 50 MG PO TABS: 50.0000 mg | ORAL_TABLET | Freq: Four times a day (QID) | ORAL | Status: DC | PRN

## 2013-12-02 MED ORDER — CEFAZOLIN SODIUM 1-5 GM-% IV SOLN
1.0000 g | INTRAVENOUS | Status: DC
  Filled 2013-12-02: qty 50

## 2013-12-02 MED ORDER — METOCLOPRAMIDE HCL 5 MG PO TABS
5.0000 mg | ORAL_TABLET | Freq: Once | ORAL | Status: AC
  Administered 2013-12-02: 5 mg via ORAL
  Filled 2013-12-02: qty 1

## 2013-12-02 MED ORDER — PROMETHAZINE HCL 25 MG/ML IJ SOLN
6.2500 mg | INTRAMUSCULAR | Status: DC | PRN
  Filled 2013-12-02: qty 1

## 2013-12-02 MED ORDER — HYDROCODONE-ACETAMINOPHEN 5-325 MG PO TABS: 1.0000 | ORAL_TABLET | Freq: Four times a day (QID) | ORAL | Status: DC | PRN

## 2013-12-02 MED ORDER — FENTANYL CITRATE 0.05 MG/ML IJ SOLN
INTRAMUSCULAR | Status: AC
  Filled 2013-12-02: qty 6

## 2013-12-02 MED ORDER — FENTANYL CITRATE 0.05 MG/ML IJ SOLN
25.0000 ug | INTRAMUSCULAR | Status: DC | PRN
  Filled 2013-12-02: qty 1

## 2013-12-02 MED ORDER — ONDANSETRON HCL 4 MG/2ML IJ SOLN
INTRAMUSCULAR | Status: DC | PRN
  Administered 2013-12-02: 4 mg via INTRAVENOUS

## 2013-12-02 MED ORDER — BUPIVACAINE HCL (PF) 0.25 % IJ SOLN
INTRAMUSCULAR | Status: DC | PRN
  Administered 2013-12-02: 10 mL

## 2013-12-02 MED ORDER — 0.9 % SODIUM CHLORIDE (POUR BTL) OPTIME
TOPICAL | Status: DC | PRN
  Administered 2013-12-02: 500 mL

## 2013-12-02 MED ORDER — EPHEDRINE SULFATE 50 MG/ML IJ SOLN
INTRAMUSCULAR | Status: DC | PRN
  Administered 2013-12-02: 5 mg via INTRAVENOUS
  Administered 2013-12-02: 10 mg via INTRAVENOUS

## 2013-12-02 MED ORDER — PROPOFOL 10 MG/ML IV BOLUS
INTRAVENOUS | Status: DC | PRN
  Administered 2013-12-02: 160 mg via INTRAVENOUS

## 2013-12-02 MED ORDER — FENTANYL CITRATE 0.05 MG/ML IJ SOLN
INTRAMUSCULAR | Status: DC | PRN
  Administered 2013-12-02: 50 ug via INTRAVENOUS
  Administered 2013-12-02 (×2): 25 ug via INTRAVENOUS

## 2013-12-02 MED ORDER — DEXAMETHASONE SODIUM PHOSPHATE 4 MG/ML IJ SOLN
INTRAMUSCULAR | Status: DC | PRN
  Administered 2013-12-02: 8 mg via INTRAVENOUS

## 2013-12-02 MED ORDER — LACTATED RINGERS IV SOLN
INTRAVENOUS | Status: DC
  Administered 2013-12-02: 09:00:00 via INTRAVENOUS
  Filled 2013-12-02: qty 1000

## 2013-12-02 MED ORDER — CEFAZOLIN SODIUM-DEXTROSE 2-3 GM-% IV SOLR
2.0000 g | INTRAVENOUS | Status: AC
  Administered 2013-12-02: 2 g via INTRAVENOUS
  Filled 2013-12-02: qty 50

## 2013-12-02 SURGICAL SUPPLY — 48 items
BLADE CLIPPER SURG (BLADE) ×3 IMPLANT
BLADE SURG 15 STRL LF DISP TIS (BLADE) ×2 IMPLANT
BLADE SURG 15 STRL SS (BLADE) ×4
COVER MAYO STAND STRL (DRAPES) ×3 IMPLANT
COVER TABLE BACK 60X90 (DRAPES) ×3 IMPLANT
DERMABOND ADVANCED (GAUZE/BANDAGES/DRESSINGS) ×2
DERMABOND ADVANCED .7 DNX12 (GAUZE/BANDAGES/DRESSINGS) ×1 IMPLANT
DISSECTOR ROUND CHERRY 3/8 STR (MISCELLANEOUS) IMPLANT
DRAIN PENROSE 18X1/2 LTX STRL (DRAIN) ×3 IMPLANT
DRAPE PED LAPAROTOMY (DRAPES) ×3 IMPLANT
DRSG KUZMA FLUFF (GAUZE/BANDAGES/DRESSINGS) ×3 IMPLANT
ELECT NEEDLE TIP 2.8 STRL (NEEDLE) ×3 IMPLANT
ELECT REM PT RETURN 9FT ADLT (ELECTROSURGICAL) ×3
ELECTRODE REM PT RTRN 9FT ADLT (ELECTROSURGICAL) ×1 IMPLANT
GAUZE SPONGE 4X4 16PLY XRAY LF (GAUZE/BANDAGES/DRESSINGS) IMPLANT
GLOVE BIO SURGEON STRL SZ7 (GLOVE) ×3 IMPLANT
GLOVE BIO SURGEON STRL SZ8 (GLOVE) ×3 IMPLANT
GLOVE BIOGEL PI IND STRL 6.5 (GLOVE) ×1 IMPLANT
GLOVE BIOGEL PI IND STRL 7.5 (GLOVE) ×2 IMPLANT
GLOVE BIOGEL PI INDICATOR 6.5 (GLOVE) ×2
GLOVE BIOGEL PI INDICATOR 7.5 (GLOVE) ×4
GLOVE SURG SS PI 7.5 STRL IVOR (GLOVE) ×3 IMPLANT
GOWN STRL REUS W/ TWL LRG LVL3 (GOWN DISPOSABLE) ×1 IMPLANT
GOWN STRL REUS W/ TWL XL LVL3 (GOWN DISPOSABLE) ×2 IMPLANT
GOWN STRL REUS W/TWL LRG LVL3 (GOWN DISPOSABLE) ×2
GOWN STRL REUS W/TWL XL LVL3 (GOWN DISPOSABLE) ×4
NEEDLE HYPO 25X1 1.5 SAFETY (NEEDLE) ×3 IMPLANT
NS IRRIG 500ML POUR BTL (IV SOLUTION) ×3 IMPLANT
PACK BASIN DAY SURGERY FS (CUSTOM PROCEDURE TRAY) ×3 IMPLANT
PENCIL BUTTON HOLSTER BLD 10FT (ELECTRODE) ×3 IMPLANT
SUPPORT SCROTAL LG STRP (MISCELLANEOUS) IMPLANT
SUPPORT SCROTAL MED ADLT STRP (MISCELLANEOUS) ×2 IMPLANT
SUPPORTER ATHLETIC LG (MISCELLANEOUS)
SUPPORTER ATHLETIC MED (MISCELLANEOUS) ×1
SUT CHROMIC 3 0 SH 27 (SUTURE) IMPLANT
SUT CHROMIC 4 0 SH 27 (SUTURE) IMPLANT
SUT MNCRL AB 4-0 PS2 18 (SUTURE) ×3 IMPLANT
SUT SILK 0 TIES 10X30 (SUTURE) ×3 IMPLANT
SUT VIC AB 3-0 SH 27 (SUTURE) ×2
SUT VIC AB 3-0 SH 27X BRD (SUTURE) ×1 IMPLANT
SUT VICRYL 2 0 18  UND BR (SUTURE)
SUT VICRYL 2 0 18 UND BR (SUTURE) IMPLANT
SYR BULB IRRIGATION 50ML (SYRINGE) ×3 IMPLANT
SYRINGE CONTROL L 12CC (SYRINGE) ×3 IMPLANT
TOWEL OR 17X24 6PK STRL BLUE (TOWEL DISPOSABLE) ×6 IMPLANT
TUBE CONNECTING 12'X1/4 (SUCTIONS) ×1
TUBE CONNECTING 12X1/4 (SUCTIONS) ×2 IMPLANT
YANKAUER SUCT BULB TIP NO VENT (SUCTIONS) ×3 IMPLANT

## 2013-12-02 NOTE — Anesthesia Preprocedure Evaluation (Signed)
Anesthesia Evaluation  Patient identified by MRN, date of birth, ID band Patient awake  General Assessment Comment:Trisomy 21  Reviewed: Allergy & Precautions, H&P , NPO status , Patient's Chart, lab work & pertinent test results  Airway Mallampati: II TM Distance: <3 FB Neck ROM: Full    Dental no notable dental hx.    Pulmonary neg pulmonary ROS,  breath sounds clear to auscultation  Pulmonary exam normal       Cardiovascular negative cardio ROS  Rhythm:Regular Rate:Normal     Neuro/Psych negative neurological ROS  negative psych ROS   GI/Hepatic negative GI ROS, Neg liver ROS,   Endo/Other  negative endocrine ROS  Renal/GU negative Renal ROS  negative genitourinary   Musculoskeletal negative musculoskeletal ROS (+)   Abdominal   Peds negative pediatric ROS (+)  Hematology negative hematology ROS (+)   Anesthesia Other Findings   Reproductive/Obstetrics negative OB ROS                           Anesthesia Physical Anesthesia Plan  ASA: II  Anesthesia Plan: General   Post-op Pain Management:    Induction: Intravenous  Airway Management Planned: LMA  Additional Equipment:   Intra-op Plan:   Post-operative Plan: Extubation in OR  Informed Consent: I have reviewed the patients History and Physical, chart, labs and discussed the procedure including the risks, benefits and alternatives for the proposed anesthesia with the patient or authorized representative who has indicated his/her understanding and acceptance.   Dental advisory given  Plan Discussed with: CRNA and Surgeon  Anesthesia Plan Comments:         Anesthesia Quick Evaluation

## 2013-12-02 NOTE — Anesthesia Procedure Notes (Signed)
Procedure Name: LMA Insertion Date/Time: 12/02/2013 10:00 AM Performed by: Denna Haggard D Pre-anesthesia Checklist: Patient identified, Emergency Drugs available, Suction available and Patient being monitored Patient Re-evaluated:Patient Re-evaluated prior to inductionOxygen Delivery Method: Circle System Utilized Preoxygenation: Pre-oxygenation with 100% oxygen Intubation Type: IV induction Ventilation: Mask ventilation without difficulty LMA: LMA inserted LMA Size: 4.0 Number of attempts: 1 Airway Equipment and Method: bite block Placement Confirmation: positive ETCO2 Tube secured with: Tape Dental Injury: Teeth and Oropharynx as per pre-operative assessment

## 2013-12-02 NOTE — H&P (Signed)
Urology History and Physical Exam  CC: Testicular mass  HPI: Zachary 38 year old Wheeler presented to Dr. Everlene Farrier last week with a right testicular mass. He is a high functioning trisomy 21 patient. He works 2 jobs. He lives with his parents. About 3 months ago he started noticing a difference in his right testicle that was not painful. He told his mom about that last week while he was walking with her. He subsequently presented to Dr. Everlene Farrier who commenced an evaluation.  Evaluation included a scrotal ultrasound which revealed a mass in his right testicle with mixed echogenicity. Serum markers were drawn-LDH, alpha-fetoprotein and beta-hCG are all normal.   He now presents for a right inguinal orchiectomy.   PMH: Past Medical History  Diagnosis Date  . Trisomy 21   . Gallstones   . Hematuria   . Down syndrome   . History of glomerulonephritis nephrologist-  dr Justin Mend    2002 resolved without kidney damage  . Family history of anesthesia complication     mother has PONV    PSH: Past Surgical History  Procedure Laterality Date  . Root canal      Allergies: Allergies  Allergen Reactions  . Codeine   . Sulfa Antibiotics   . Terbinafine Hcl     Medications: No prescriptions prior to admission     Social History: History   Social History  . Marital Status: Single    Spouse Name: N/A    Number of Children: N/A  . Years of Education: 12   Occupational History  . janitor    Social History Main Topics  . Smoking status: Never Smoker   . Smokeless tobacco: Not on file  . Alcohol Use: Yes     Comment: 1 beer on occas  . Drug Use: No  . Sexual Activity: Not on file   Other Topics Concern  . Not on file   Social History Narrative   Exercises regularly.    Family History: Family History  Problem Relation Age of Onset  . Hypertension Mother   . Hyperlipidemia Mother   . Hypertension Maternal Grandmother   . Emphysema Maternal Grandmother   . Hypertension Maternal  Grandfather   . Alcohol abuse Maternal Grandfather   . Cancer Paternal Grandmother   . Congestive Heart Failure Paternal Grandfather     Review of Systems: Positive: Right teticular mass Negative:  A further 10 point review of systems was negative except what is listed in the HPI.                  Physical Exam: @VITALS2 @ Constitutional: Well nourished and well developed . No acute distress.  ENT:. The ears and nose are normal in appearance.  Neck: The appearance of the neck is normal.  Pulmonary: No respiratory distress and normal respiratory rhythm and effort.  Abdomen: The abdomen is flat. The abdomen is soft and nontender. No masses are palpated. No CVA tenderness. No hernias are palpable. No hepatosplenomegaly noted.  Rectal: The prostate exam was deferred.  Genitourinary: Examination of the penis demonstrates no discharge, no masses, no lesions and a normal meatus. The penis is circumcised. The scrotum is normal in appearance and without lesions. The right vas deferens is is palpably normal. The left vas deferens is palpably normal. The right epididymis is palpably normal and non-tender. The left epididymis is palpably normal and non-tender. The right spermatic cord is palpably normal. The right testis is found to have a 5 cm right testicular mass, but  non-tender. The left testis is normal, non-tender and without masses.  Lymphatics: The posterior cervical, anterior cervical, supraclavicular, axillary, femoral and inguinal nodes are not enlarged or tender.  Skin: Normal skin turgor, no visible rash and no visible skin lesions.  Neuro/Psych:. Mood and affect are appropriate.      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 Markers including LDH, alpha-fetoprotein and beta hCG are normal   Plan: Right inguinal orchiectomy

## 2013-12-02 NOTE — Transfer of Care (Signed)
Immediate Anesthesia Transfer of Care Note  Patient: Zachary Wheeler  Procedure(s) Performed: Procedure(s) (LRB): INGUINAL ORCHIECTOMY (Right)  Patient Location: PACU  Anesthesia Type: General  Level of Consciousness: awake, oriented, sedated and patient cooperative  Airway & Oxygen Therapy: Patient Spontanous Breathing and Patient connected to face mask oxygen  Post-op Assessment: Report given to PACU RN and Post -op Vital signs reviewed and stable  Post vital signs: Reviewed and stable  Complications: No apparent anesthesia complications

## 2013-12-02 NOTE — Discharge Instructions (Signed)
Discharge instructions following scrotal surgery  Call your doctor for:  Fever is greater than 100.5  Severe nausea or vomiting  Increasing pain not controlled by pain medication  Increasing redness or drainage from incisions  The number for questions or concerns is [phone]  Activity level: No lifting greater than 10 pounds (about equal to milk) for the next 2 weeks or until cleared to do so at follow-up appointment.  Otherwise activity as tolerated by comfort level.  Diet: May resume your regular diet as tolerated  Driving: No driving while still taking opiate pain medications (weight at least 6-8 hours after last dose).  No driving if you still sore from surgery as it may limit her ability to react quickly if necessary.   Shower/bath: May shower and get incision wet pad dry immediately following.  Do not scrub vigorously for the next 2-3 weeks.  Do not soak incision (ID soaking in bath or swimming) until told he may do so by Dr., as this may promote a wound infection.  Wound care: He may cover wounds with sterile gauze as needed to prevent incisions rubbing on close follow-up in any seepage.  Where tight fitting underpants for at least 2 weeks.  He should apply cold compresses (ice or sac of frozen peas/corn) to your scrotum for at least 48 hours to reduce the swelling.  You should expect that his scrotum will swell up initially and then get smaller over the next 2-4 weeks.     Post Anesthesia Home Care Instructions  Activity: Get plenty of rest for the remainder of the day. A responsible adult should stay with you for 24 hours following the procedure.  For the next 24 hours, DO NOT: -Drive a car -Paediatric nurse -Drink alcoholic beverages -Take any medication unless instructed by your physician -Make any legal decisions or sign important papers.  Meals: Start with liquid foods such as gelatin or soup. Progress to regular foods as tolerated. Avoid greasy, spicy,  heavy foods. If nausea and/or vomiting occur, drink only clear liquids until the nausea and/or vomiting subsides. Call your physician if vomiting continues.  Special Instructions/Symptoms: Your throat may feel dry or sore from the anesthesia or the breathing tube placed in your throat during surgery. If this causes discomfort, gargle with warm salt water. The discomfort should disappear within 24 hours.

## 2013-12-02 NOTE — Op Note (Signed)
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 Mickayla Trouten, M.D.  FIRST ASSISTANT: Francoise Schaumann, M.D.  ANESTHESIA:  General  EBL:  Minimal  DRAINS: None  LOCAL MEDICATIONS USED: 10 mL quarter percent plain Marcaine  SPECIMEN:  Right cord and testicle  INDICATION: [name] is a 38 year old male with evidence of a right testicular mass. He presents for right radical orchiectomy.  Description of procedure: The patient was properly identified and marked (if applicable) in the holding area. They were then  taken to the operating room and placed on the table in a supine position. General anesthesia was then administered. The lower abdomen and genitalia were prepped and draped. Proper timeout was then performed.  A 2.5 cm incision was then made in an oblique fashion in the right inguinal region. It was carried down to the right external inguinal ring using sharp and blunt dissection. The spermatic cord was identified and dissected. The testicle was delivered from the scrotum following incision of the gubernaculum. The cord was then skeletonized following placement of a Penrose drain and a tourniquet fashion. The proximal cord was blocked with 6 mL of quarter percent plain Marcaine. The cord was then clamped in 2 tissue packets with hemostats. The cord was then divided, and the cord and testicle were sent for permanent specimen/pathologic review. Each tissue package was doubly ligated with #1 silk ties. Hemostasis was excellent. The cord stump was then pushed up into the internal inguinal ring. The scrotum was everted and inspected. No bleeding was seen. The surgical site was found to be hemostatic. The remaining 4 mL of Marcaine was placed in the skin and subcutaneous tissues. The wound was closed using 3-0 Vicryl in a running fashion and the  subcutaneous tissue, with the skin being closed using a subcuticular suture of 4-0 Monocryl. Dermabond was placed on the skin. A compressive dressing was placed underneath an athletic supporter. The patient tolerated procedure well and was returned to the PACU in stable condition.

## 2013-12-02 NOTE — Anesthesia Postprocedure Evaluation (Signed)
  Anesthesia Post-op Note  Patient: Zachary Wheeler  Procedure(s) Performed: Procedure(s) (LRB): INGUINAL ORCHIECTOMY (Right)  Patient Location: PACU  Anesthesia Type: General  Level of Consciousness: awake and alert   Airway and Oxygen Therapy: Patient Spontanous Breathing  Post-op Pain: mild  Post-op Assessment: Post-op Vital signs reviewed, Patient's Cardiovascular Status Stable, Respiratory Function Stable, Patent Airway and No signs of Nausea or vomiting  Last Vitals:  Filed Vitals:   12/02/13 1115  BP: 108/64  Pulse: 75  Temp:   Resp: 13    Post-op Vital Signs: stable   Complications: No apparent anesthesia complications

## 2013-12-02 NOTE — Addendum Note (Signed)
Addendum created 12/02/13 1338 by Salley Scarlet, MD   Modules edited: Orders

## 2013-12-03 ENCOUNTER — Encounter (HOSPITAL_BASED_OUTPATIENT_CLINIC_OR_DEPARTMENT_OTHER): Payer: Self-pay | Admitting: Urology

## 2014-01-08 ENCOUNTER — Ambulatory Visit: Payer: Medicare Other | Admitting: Radiation Oncology

## 2014-01-08 ENCOUNTER — Ambulatory Visit: Payer: Medicare Other

## 2014-01-10 ENCOUNTER — Encounter: Payer: Self-pay | Admitting: Radiation Oncology

## 2014-01-10 NOTE — Progress Notes (Signed)
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 He brought it to his mother's attention early Oct. and presented to Dr Everlene Farrier for evaluation.  Biopsies of  (if applicable) revealed:  82/64/15 Diagnosis Testis, tumor, right - SEMINOMA. - LYMPHOVASCULAR INVASION PRESENT. - SPERMATIC CORD MARGIN IS NEGATIVE.  Past/Anticipated interventions by urology, if any: serum markers drawn, right orchiectomy  Past/Anticipated interventions by medical oncology, if any: none  Weight changes, if any: 4-5 lbs  Bowel/Bladder complaints, if any:  no  Nausea/Vomiting, if any: no  Pain issues, if any:  no  SAFETY ISSUES:  Prior radiation? no  Pacemaker/ICD?  no  Possible current pregnancy? na  Is the patient on methotrexate? no  Current Complaints / other details: High functioning Trisomy 21. Works 2 jobs, one at [employer] and the other job is at business that [employer]. Lives with parents. Consultation for discussion of possible radiotherapy for seminoma.

## 2014-01-14 ENCOUNTER — Ambulatory Visit
Admission: RE | Admit: 2014-01-14 | Discharge: 2014-01-14 | Disposition: A | Discharge status: Home / Self Care | Payer: Medicare Other | Source: Ambulatory Visit | Attending: Radiation Oncology | Admitting: Radiation Oncology

## 2014-01-14 ENCOUNTER — Institutional Professional Consult (permissible substitution): Payer: Medicare Other | Admitting: Radiation Oncology

## 2014-01-14 ENCOUNTER — Encounter: Payer: Self-pay | Admitting: Radiation Oncology

## 2014-01-14 VITALS — BP 114/62 | HR 66 | Temp 97.7°F | Resp 20 | Ht 60.0 in | Wt 155.2 lb

## 2014-01-14 DIAGNOSIS — Z510 Encounter for antineoplastic radiation therapy: Secondary | ICD-10-CM | POA: Diagnosis not present

## 2014-01-14 DIAGNOSIS — C6291 Malignant neoplasm of right testis, unspecified whether descended or undescended: Secondary | ICD-10-CM | POA: Insufficient documentation

## 2014-01-14 HISTORY — DX: Malignant (primary) neoplasm, unspecified: C80.1

## 2014-01-14 HISTORY — DX: Calculus of gallbladder without cholecystitis without obstruction: K80.20

## 2014-01-14 NOTE — Progress Notes (Signed)
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 male who is seen today through the courtesy Dr. Diona Fanti for discussion of management of his pT2 seminoma of the right testis.  He presented with a three-month history of an enlarging right testis.  He saw his primary care physician, Dr. Everlene Farrier, and was referred to Dr. Diona Fanti for further evaluation.  His preoperative beta hCG and alpha-fetoprotein were within normal limits.  On 12/02/2013 he underwent a right radical orchiectomy.  Tumor involved the tunica albuginea, and there was lymphovascular space invasion.  The tumor measured 6.2 cm.  He is doing well postoperatively.  His postoperative CT scan  of the abdomen and pelvis on 12/16/2013 was without evidence for metastatic disease.  There were postsurgical changes seen along the right inguinal region.  Of note is that he has no previous history of abdominal or pelvic surgery.  PREVIOUS RADIATION THERAPY: No   PAST MEDICAL HISTORY:  has a past medical history of Trisomy 21; Gallstones; Hematuria; Down syndrome; History of glomerulonephritis (nephrologist-  dr Justin Mend); Family history of anesthesia complication; Cancer (12/02/13); and Gall stones.     PAST SURGICAL HISTORY:  Past Surgical History  Procedure Laterality Date  . Root canal    . Orchiectomy Right 12/02/2013    Procedure: INGUINAL ORCHIECTOMY;  Surgeon: Jorja Loa, MD;  Location: [facility];  Service: Urology;  Laterality: Right;     FAMILY HISTORY: family history includes Alcohol abuse in his maternal grandfather; Cancer in his paternal grandmother;  Congestive Heart Failure in his paternal grandfather; Emphysema in his maternal grandmother; Hyperlipidemia in his mother; Hypertension in his maternal grandfather, maternal grandmother, and mother. No family history of testicular malignancy.   SOCIAL HISTORY:  reports that he has never smoked. He does not have any smokeless tobacco history on file. He reports that he drinks alcohol. He reports that he does not use illicit drugs. Single, no children.  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   ALLERGIES: Codeine; Sulfa antibiotics; and Terbinafine hcl   MEDICATIONS:  Current Outpatient Prescriptions  Medication Sig Dispense Refill  . Loratadine (CLARITIN) 10 MG CAPS Take by mouth as needed.     No current facility-administered medications for this encounter.     REVIEW OF SYSTEMS:  Pertinent items are noted in HPI.    PHYSICAL EXAM:  height is 5' (1.524 m) and weight is 155 lb 3.2 oz (70.398 kg). His temperature is 97.7 F (36.5 C). His blood pressure is 114/62 and his pulse is 66. His respiration is 20.   He is not examined today.   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  We had a lengthy discussion regarding surveillance versus radiation therapy versus  chemotherapy.  We reviewed the 2016 NCCN guidelines.  Of note is that the previous [place] surveillance study suggested a correlation between primary tumor size and risk for recurrent disease, but more recent data does not see a  correlation for the risk of metastatic disease based on tumor size.  Lymphovascular space invasion may increase his risk for recurrent disease, but I would estimate that his risk for a periaortic recurrence is probably no greater than 20-25%.  We discussed the fact that chemotherapy could be utilized for salvage with a similar eventual cure rate approaching 99%.  I emphasized the need that he will need to have follow-up CT scans through Dr. Diona Fanti.  The NCCN recommendation is a follow-up scans of the abdomen (plus or minus pelvis) at 3 months, 6 months, and then at one year.  Subsequent scans can be done every 6 months to a year.  Tumor markers and chest x-rays are optional, although I feel that an annual chest x-ray for the first few years would certainly be reasonable.   We discussed the fact that the vast majority of recurrences are within the first 2 years, but he will need to have CT scans for at least 5 years.  We discussed the history and role of radiation therapy in this setting.  We discussed the potential acute and late toxicities of radiation therapy including the risk for a radiation-induced malignancy which may be in the 4-5% range (1-2x the normal risk) even with reduced doses and field volumes.  After lengthy discussion he and his family are most interested in close surveillance which I think is most appropriate.   PLAN: As discussed above.   I spent 30 minutes minutes face to face with the patient and more than 50% of that time was spent in counseling and/or coordination of care.

## 2014-01-20 NOTE — Addendum Note (Signed)
Encounter addended by: Andria Rhein, RN on: 01/20/2014  4:50 PM<BR>     Documentation filed: Charges VN

## 2014-06-20 ENCOUNTER — Ambulatory Visit (HOSPITAL_COMMUNITY)
Admission: RE | Admit: 2014-06-20 | Discharge: 2014-06-20 | Disposition: A | Discharge status: Home / Self Care | Payer: Medicare Other | Source: Ambulatory Visit | Attending: Urology | Admitting: Urology

## 2014-06-20 ENCOUNTER — Other Ambulatory Visit: Payer: Self-pay | Admitting: Urology

## 2014-06-20 DIAGNOSIS — C6290 Malignant neoplasm of unspecified testis, unspecified whether descended or undescended: Secondary | ICD-10-CM

## 2014-06-30 ENCOUNTER — Encounter: Payer: Self-pay | Admitting: Radiation Oncology

## 2014-06-30 NOTE — Progress Notes (Signed)
Right Testicular Seminoma  Zachary Wheeler presented 6 months ago with a noted difference in his right testicle, but not painful. He brought it to his mother's attention early Oct. And he presented to Dr Everlene Farrier for evaluation.  Biopsies of (if applicable) revealed:  78/47/84  Diagnosis Testis, tumor, right - SEMINOMA. - LYMPHOVASCULAR INVASION PRESENT. - SPERMATIC CORD MARGIN IS NEGATIVE.  Seen By Dr. Valere Dross on 01/14/2014 and Mr. Adell and his family decided on surveillance.   06/18/14 CT Scan of Abdomen and Pelvis  : Interval development of aortocaval adenopathy worrisome for metastatic disease from patient's known testicular cancer with a lymph node measuring 1.7 cm, image 37/series 2.   No additional enlarged upper abdominal lymph nodes.  No pelvic or inguinal adenopathy.    Gallstones.   No lytic lesions, ascites or focal fluid collection within the abdomen or pelvis.  No aggressive lytic or sclerotic bone lesions. Gallstones present  Pain:  Radiation Therapy: No Pacemaker: No Pregnancy: N./A Methotrexate; No

## 2014-07-01 ENCOUNTER — Encounter: Payer: Self-pay | Admitting: Radiation Oncology

## 2014-07-01 ENCOUNTER — Ambulatory Visit
Admission: RE | Admit: 2014-07-01 | Discharge: 2014-07-01 | Disposition: A | Discharge status: Home / Self Care | Payer: Medicare Other | Source: Ambulatory Visit | Attending: Radiation Oncology | Admitting: Radiation Oncology

## 2014-07-01 VITALS — BP 111/71 | HR 71 | Temp 98.2°F | Ht 60.0 in | Wt 154.4 lb

## 2014-07-01 DIAGNOSIS — C6291 Malignant neoplasm of right testis, unspecified whether descended or undescended: Secondary | ICD-10-CM

## 2014-07-01 NOTE — Addendum Note (Signed)
Encounter addended by: Benn Moulder, RN on: 07/01/2014  1:51 PM<BR>     Documentation filed: Charges VN

## 2014-07-01 NOTE — Progress Notes (Signed)
CC: Dr. Ivar Bury, Dr. Franchot Gallo  Follow-up note:  Diagnosis: Initial stage I B (pT2 NX M0) pure seminoma of the right testis, now recurrent, Stage IIA (N1)  Zachary Wheeler is a pleasant 39 year old male who is seen today at the request of Dr. Diona Fanti for consideration of radiation therapy in the management of his recurrent seminoma of the right testis involving a right aortocaval lymph node.  I first saw the patient in consultation on 01/14/2014.  He presented with a three-month history of an enlarging right testis. He saw his primary care physician, Dr. Everlene Farrier, and was referred to Dr. Diona Fanti for further evaluation. His preoperative beta hCG and alpha-fetoprotein were within normal limits. On 12/02/2013 he underwent a right radical orchiectomy. Tumor involved the tunica albuginea, and there was lymphovascular space invasion. The tumor measured 6.2 cm. He is doing well postoperatively. His postoperative CT scan of the abdomen and pelvis on 12/16/2013 was without evidence for metastatic disease. There were postsurgical changes seen along the right inguinal region. Of note is that he has no previous history of abdominal or pelvic surgery.  He elected for close surveillance/observation.  Unfortunately, a CT scan of the abdomen/pelvis on 06/18/2014 showed a 1.7 cm enlarged aortocaval lymph node.  There were no additional upper abdominal/periaortic/pelvic or inguinal adenopathy.  CT of the chest on 06/25/2014 was without evidence for metastatic disease.  Tumor markers were unremarkable with a normal LDH on 06/25/2014 of 134.  Alpha-fetoprotein on 06/25/2014 was 3.4.  Beta hCG from 03/03/2014 was less than 2.0, within normal limits.  He is without complaints today.  He remains active.  Physical examination: Alert and oriented. Filed Vitals:   07/01/14 1217  BP: 111/71  Pulse: 71  Temp: 98.2 F (36.8 C)   Head and neck examination: Grossly unremarkable.  Nodes: Without palpable  cervical, supraclavicular, infraclavicular or inguinal lymphadenopathy.  Chest: Lungs clear.  Abdomen: Without masses organomegaly.  Genitalia: Unremarkable to inspection.  Normal testicular examination by Dr. Diona Fanti.  Laboratory data: As above.  Depression: Recurrent seminoma of the right testis involving an aortocaval lymph node.  In view of his non-bulky recurrence, we do recommend radiation therapy for cure.  I reviewed his case with Dr. Alen Blew, and he feels that radiation therapy would be the appropriate treatment.  Our plan is to treat the right pelvic and aortic lymph nodes.  He will receive 3-1/2 weeks of radiation therapy per the NCCN guidelines.  We discussed the potential acute and late toxicities of radiation therapy.  Consent is signed today.  After he and his father return from a trip next week he will return for simulation/treatment planning on May 19.  He will start treatment the following week.  45 minutes was spent face-to-face with the patient and his family, primarily counseling patient and coordinating his care.

## 2014-07-10 ENCOUNTER — Ambulatory Visit
Admission: RE | Admit: 2014-07-10 | Discharge: 2014-07-10 | Disposition: A | Discharge status: Home / Self Care | Payer: Medicare Other | Source: Ambulatory Visit | Attending: Radiation Oncology | Admitting: Radiation Oncology

## 2014-07-10 DIAGNOSIS — C6291 Malignant neoplasm of right testis, unspecified whether descended or undescended: Secondary | ICD-10-CM | POA: Diagnosis not present

## 2014-07-10 NOTE — Progress Notes (Signed)
Complex simulation/treatment planning note: The patient was taken to the CT simulator.  A Vac lock immobilization device was constructed.  He was then scanned.  The CT data set was sent to the planning system where contoured his spinal cord, and kidneys.  I also contoured his recurrent disease/GTV along the aortocaval region.  He was set up to AP and PA fields in 2 unique MLCs were designed to conform the field.  I prescribing 2550 cGy in 15 sessions utilizing 15 MV photons.  This will be followed by a boost of 600 cGy in 3 sessions.  He is now ready for 3-D simulation.

## 2014-07-14 ENCOUNTER — Encounter: Payer: Self-pay | Admitting: Radiation Oncology

## 2014-07-14 DIAGNOSIS — C6291 Malignant neoplasm of right testis, unspecified whether descended or undescended: Secondary | ICD-10-CM | POA: Diagnosis not present

## 2014-07-14 NOTE — Progress Notes (Signed)
3-D simulation note: The patient completed 3-D simulation for treatment to his abdomen/pelvis in the management of his recurrent seminoma of the right testis.  He was set up AP and PA.  Dose volume histograms were obtained for the kidneys and spinal cord.  We met our departmental guidelines.  2 separate and unique MLCs were designed to conform the field.  I am prescribing 2550 cGy in 15 sessions utilizing 15 MV photons.

## 2014-07-18 ENCOUNTER — Ambulatory Visit
Admission: RE | Admit: 2014-07-18 | Discharge: 2014-07-18 | Disposition: A | Discharge status: Home / Self Care | Payer: Medicare Other | Source: Ambulatory Visit | Attending: Radiation Oncology | Admitting: Radiation Oncology

## 2014-07-18 DIAGNOSIS — C6291 Malignant neoplasm of right testis, unspecified whether descended or undescended: Secondary | ICD-10-CM | POA: Diagnosis not present

## 2014-07-22 ENCOUNTER — Encounter: Payer: Self-pay | Admitting: Radiation Oncology

## 2014-07-22 ENCOUNTER — Ambulatory Visit
Admission: RE | Admit: 2014-07-22 | Discharge: 2014-07-22 | Disposition: A | Discharge status: Home / Self Care | Payer: Medicare Other | Source: Ambulatory Visit | Attending: Radiation Oncology | Admitting: Radiation Oncology

## 2014-07-22 VITALS — BP 119/70 | HR 74 | Temp 98.0°F | Ht 60.0 in | Wt 154.5 lb

## 2014-07-22 DIAGNOSIS — Z510 Encounter for antineoplastic radiation therapy: Secondary | ICD-10-CM | POA: Insufficient documentation

## 2014-07-22 DIAGNOSIS — C6291 Malignant neoplasm of right testis, unspecified whether descended or undescended: Secondary | ICD-10-CM | POA: Diagnosis not present

## 2014-07-22 MED ORDER — RADIAPLEXRX EX GEL
Freq: Once | CUTANEOUS | Status: AC
  Administered 2014-07-22: 17:00:00 via TOPICAL

## 2014-07-22 NOTE — Progress Notes (Signed)
Weekly Management Note:  Site: Abdomen/pelvis Current Dose:  170  cGy Projected Dose: 2550  cGy followed by 3 fraction boost  Narrative: The patient is seen today for routine under treatment assessment. CBCT/MVCT images/port films were reviewed. The chart was reviewed.   He begins his radiation therapy today.  He is without complaints.  His treatment setup is excellent.  Physical Examination:  Filed Vitals:   07/22/14 1549  BP: 119/70  Pulse: 74  Temp: 98 F (36.7 C)  .  Weight: 154 lb 8 oz (70.081 kg).  No change.  Impression: Tolerating radiation therapy well.  Plan: Continue radiation therapy as planned.

## 2014-07-22 NOTE — Addendum Note (Signed)
Encounter addended by: Benn Moulder, RN on: 07/22/2014  5:25 PM<BR>     Documentation filed: Inpatient MAR

## 2014-07-22 NOTE — Progress Notes (Signed)
Mr. Morais has received  1 fraction today.    Pt here for patient teaching.  Pt given Radiation and You booklet, skin care instructions and Radiaplex gel. Reviewed areas of pertinence such as diarrhea, fatigue, nausea and vomiting and skin changes . Pt able to give teach back of to pat skin, use unscented/gentle soap, use baby wipes, have Imodium on hand, drink plenty of water and sitz bath,apply Radiaplex bid and avoid applying anything to skin within 4 hours of treatment. Pt demonstrated understanding and verbalizes understanding of information given and will contact nursing with any questions or concerns.  Accompanied by his family.  All stated understanding.

## 2014-07-23 ENCOUNTER — Ambulatory Visit
Admission: RE | Admit: 2014-07-23 | Discharge: 2014-07-23 | Disposition: A | Discharge status: Home / Self Care | Payer: Medicare Other | Source: Ambulatory Visit | Attending: Radiation Oncology | Admitting: Radiation Oncology

## 2014-07-23 ENCOUNTER — Other Ambulatory Visit: Payer: Self-pay | Admitting: Radiation Oncology

## 2014-07-23 ENCOUNTER — Encounter: Payer: Self-pay | Admitting: Radiation Oncology

## 2014-07-23 DIAGNOSIS — C6291 Malignant neoplasm of right testis, unspecified whether descended or undescended: Secondary | ICD-10-CM | POA: Diagnosis not present

## 2014-07-23 DIAGNOSIS — R112 Nausea with vomiting, unspecified: Secondary | ICD-10-CM

## 2014-07-23 MED ORDER — PROCHLORPERAZINE MALEATE 10 MG PO TABS: 10.0000 mg | ORAL_TABLET | Freq: Four times a day (QID) | ORAL | Status: DC | PRN

## 2014-07-23 MED ORDER — PROCHLORPERAZINE 25 MG RE SUPP: 25.0000 mg | Freq: Two times a day (BID) | RECTAL | Status: DC | PRN

## 2014-07-23 NOTE — Progress Notes (Signed)
Zachary Wheeler underwent his reduced field boost planning today.  He is set up AP and PA with 15 MV photons.  We took a 2 cm margin around his gross disease for his block margin.  2 unique sets of MLCs are designed to conform the field.

## 2014-07-23 NOTE — Progress Notes (Signed)
Chart note: Mrs. Ikard called me today to tell me that her son had some nausea yesterday evening but has been vomiting since late morning.  I called a prescription for Compazine 25 mg suppositories and also Compazine 10 mg tablets.  I spoke with Mrs. Launer and told her that he could take 1 or the other but not both at the same time.  He is be on clear liquids this evening.  I can see him tomorrow morning.

## 2014-07-24 ENCOUNTER — Ambulatory Visit
Admission: RE | Admit: 2014-07-24 | Discharge: 2014-07-24 | Disposition: A | Discharge status: Home / Self Care | Payer: Medicare Other | Source: Ambulatory Visit | Attending: Radiation Oncology | Admitting: Radiation Oncology

## 2014-07-24 DIAGNOSIS — C6291 Malignant neoplasm of right testis, unspecified whether descended or undescended: Secondary | ICD-10-CM | POA: Diagnosis not present

## 2014-07-25 ENCOUNTER — Ambulatory Visit
Admission: RE | Admit: 2014-07-25 | Discharge: 2014-07-25 | Disposition: A | Discharge status: Home / Self Care | Payer: Medicare Other | Source: Ambulatory Visit | Attending: Radiation Oncology | Admitting: Radiation Oncology

## 2014-07-25 DIAGNOSIS — C6291 Malignant neoplasm of right testis, unspecified whether descended or undescended: Secondary | ICD-10-CM | POA: Diagnosis not present

## 2014-07-28 ENCOUNTER — Encounter: Payer: Self-pay | Admitting: Radiation Oncology

## 2014-07-28 ENCOUNTER — Ambulatory Visit
Admission: RE | Admit: 2014-07-28 | Discharge: 2014-07-28 | Disposition: A | Discharge status: Home / Self Care | Payer: Medicare Other | Source: Ambulatory Visit | Attending: Radiation Oncology | Admitting: Radiation Oncology

## 2014-07-28 VITALS — BP 111/67 | HR 75 | Temp 98.6°F | Ht 60.0 in | Wt 151.9 lb

## 2014-07-28 DIAGNOSIS — C6291 Malignant neoplasm of right testis, unspecified whether descended or undescended: Secondary | ICD-10-CM

## 2014-07-28 NOTE — Progress Notes (Signed)
Weekly Management Note:  Site: Abdomen/right pelvis (hockey-stick) 850 Current Dose:  2550  cGy followed by 3 fraction boost Projected Dose: 3050  cGy  Narrative: The patient is seen today for routine under treatment assessment. CBCT/MVCT images/port films were reviewed. The chart was reviewed.   He responded well to Compazine suppositories after his nausea vomiting last week.  He did take Compazine by mouth prophylactically before his treatment today.  No nausea or vomiting over the weekend.  Physical Examination:  Filed Vitals:   07/28/14 0943  BP: 111/67  Pulse: 75  Temp:   .  Weight: 151 lb 14.4 oz (68.901 kg).  No change.  Impression: Tolerating radiation therapy well.  Plan: Continue radiation therapy as planned.

## 2014-07-28 NOTE — Progress Notes (Addendum)
Zachary Wheeler has received 5 fractions.  Denies any any further episodes of N/V since he received Compazine po and supp.  Denies pain today. BP 105/49 mmHg  Pulse 69  Temp(Src) 98.6 F (37 C)  Ht 5' (1.524 m)  Wt 151 lb 14.4 oz (68.901 kg)  BMI 29.67 kg/m2 -Sitting  BP 111/67 mmHg  Pulse 75  Temp(Src) 98.6 F (37 C)  Ht 5' (1.524 m)  Wt 151 lb 14.4 oz (68.901 kg)  BMI 29.67 kg/m2 - Standing

## 2014-07-29 ENCOUNTER — Ambulatory Visit
Admission: RE | Admit: 2014-07-29 | Discharge: 2014-07-29 | Disposition: A | Discharge status: Home / Self Care | Payer: Medicare Other | Source: Ambulatory Visit | Attending: Radiation Oncology | Admitting: Radiation Oncology

## 2014-07-29 DIAGNOSIS — C6291 Malignant neoplasm of right testis, unspecified whether descended or undescended: Secondary | ICD-10-CM | POA: Diagnosis not present

## 2014-07-30 ENCOUNTER — Ambulatory Visit
Admission: RE | Admit: 2014-07-30 | Discharge: 2014-07-30 | Disposition: A | Discharge status: Home / Self Care | Payer: Medicare Other | Source: Ambulatory Visit | Attending: Radiation Oncology | Admitting: Radiation Oncology

## 2014-07-30 DIAGNOSIS — C6291 Malignant neoplasm of right testis, unspecified whether descended or undescended: Secondary | ICD-10-CM | POA: Diagnosis not present

## 2014-07-31 ENCOUNTER — Ambulatory Visit
Admission: RE | Admit: 2014-07-31 | Discharge: 2014-07-31 | Disposition: A | Discharge status: Home / Self Care | Payer: Medicare Other | Source: Ambulatory Visit | Attending: Radiation Oncology | Admitting: Radiation Oncology

## 2014-07-31 DIAGNOSIS — C6291 Malignant neoplasm of right testis, unspecified whether descended or undescended: Secondary | ICD-10-CM | POA: Diagnosis not present

## 2014-08-01 ENCOUNTER — Ambulatory Visit
Admission: RE | Admit: 2014-08-01 | Discharge: 2014-08-01 | Disposition: A | Discharge status: Home / Self Care | Payer: Medicare Other | Source: Ambulatory Visit | Attending: Radiation Oncology | Admitting: Radiation Oncology

## 2014-08-01 DIAGNOSIS — C6291 Malignant neoplasm of right testis, unspecified whether descended or undescended: Secondary | ICD-10-CM | POA: Diagnosis not present

## 2014-08-04 ENCOUNTER — Ambulatory Visit
Admission: RE | Admit: 2014-08-04 | Discharge: 2014-08-04 | Disposition: A | Discharge status: Home / Self Care | Payer: Medicare Other | Source: Ambulatory Visit | Attending: Radiation Oncology | Admitting: Radiation Oncology

## 2014-08-04 ENCOUNTER — Encounter: Payer: Self-pay | Admitting: Radiation Oncology

## 2014-08-04 VITALS — BP 106/65 | HR 70 | Temp 97.6°F | Resp 20 | Wt 154.9 lb

## 2014-08-04 DIAGNOSIS — C6291 Malignant neoplasm of right testis, unspecified whether descended or undescended: Secondary | ICD-10-CM | POA: Diagnosis not present

## 2014-08-04 NOTE — Progress Notes (Signed)
Weekly rad txs 10/18 completed  Seminoma right testes, no c/o pain, no dyuria, hematuria, regular bowel movements, eating better, takes compazine oral before treatment that has helped stataed mom and patient 9:29 AM BP 106/65 mmHg  Pulse 70  Temp(Src) 97.6 F (36.4 C) (Oral)  Resp 20  Wt 154 lb 14.4 oz (70.262 kg)  Wt Readings from Last 3 Encounters:  08/04/14 154 lb 14.4 oz (70.262 kg)  07/28/14 151 lb 14.4 oz (68.901 kg)  07/22/14 154 lb 8 oz (70.081 kg)

## 2014-08-04 NOTE — Progress Notes (Signed)
   Weekly Management Note:  Outpatient    ICD-9-CM ICD-10-CM   1. Seminoma of right testis 186.9 C62.91     Current Dose:  17 Gy  Projected Dose: 31.5 Gy   Narrative:  The patient presents for routine under treatment assessment.  CBCT/MVCT images/Port film x-rays were reviewed.  The chart was checked. Seminoma right testes, no c/o pain, no dyuria, hematuria, regular bowel movements, eating better, takes compazine oral before treatment that has helped stated family and patient. He denies fatigue.     Physical Findings:  weight is 154 lb 14.4 oz (70.262 kg). His oral temperature is 97.6 F (36.4 C). His blood pressure is 106/65 and his pulse is 70. His respiration is 20.   Wt Readings from Last 3 Encounters:  08/04/14 154 lb 14.4 oz (70.262 kg)  07/28/14 151 lb 14.4 oz (68.901 kg)  07/22/14 154 lb 8 oz (70.081 kg)   NAD  Impression:  The patient is tolerating radiotherapy.  Plan:  Continue radiotherapy as planned.    This document serves as a record of services personally performed by Eppie Gibson, MD. It was created on her behalf by Arlyce Harman, a trained medical scribe. The creation of this record is based on the scribe's personal observations and the provider's statements to them. This document has been checked and approved by the attending provider. ________________________________   Eppie Gibson, M.D.

## 2014-08-05 ENCOUNTER — Ambulatory Visit
Admission: RE | Admit: 2014-08-05 | Discharge: 2014-08-05 | Disposition: A | Discharge status: Home / Self Care | Payer: Medicare Other | Source: Ambulatory Visit | Attending: Radiation Oncology | Admitting: Radiation Oncology

## 2014-08-05 DIAGNOSIS — C6291 Malignant neoplasm of right testis, unspecified whether descended or undescended: Secondary | ICD-10-CM | POA: Diagnosis not present

## 2014-08-06 ENCOUNTER — Ambulatory Visit
Admission: RE | Admit: 2014-08-06 | Discharge: 2014-08-06 | Disposition: A | Discharge status: Home / Self Care | Payer: Medicare Other | Source: Ambulatory Visit | Attending: Radiation Oncology | Admitting: Radiation Oncology

## 2014-08-06 DIAGNOSIS — C6291 Malignant neoplasm of right testis, unspecified whether descended or undescended: Secondary | ICD-10-CM | POA: Diagnosis not present

## 2014-08-07 ENCOUNTER — Ambulatory Visit
Admission: RE | Admit: 2014-08-07 | Discharge: 2014-08-07 | Disposition: A | Discharge status: Home / Self Care | Payer: Medicare Other | Source: Ambulatory Visit | Attending: Radiation Oncology | Admitting: Radiation Oncology

## 2014-08-07 DIAGNOSIS — C6291 Malignant neoplasm of right testis, unspecified whether descended or undescended: Secondary | ICD-10-CM | POA: Diagnosis not present

## 2014-08-08 ENCOUNTER — Ambulatory Visit
Admission: RE | Admit: 2014-08-08 | Discharge: 2014-08-08 | Disposition: A | Discharge status: Home / Self Care | Payer: Medicare Other | Source: Ambulatory Visit | Attending: Radiation Oncology | Admitting: Radiation Oncology

## 2014-08-08 DIAGNOSIS — C6291 Malignant neoplasm of right testis, unspecified whether descended or undescended: Secondary | ICD-10-CM | POA: Diagnosis not present

## 2014-08-11 ENCOUNTER — Ambulatory Visit
Admission: RE | Admit: 2014-08-11 | Discharge: 2014-08-11 | Disposition: A | Discharge status: Home / Self Care | Payer: Medicare Other | Source: Ambulatory Visit | Attending: Radiation Oncology | Admitting: Radiation Oncology

## 2014-08-11 DIAGNOSIS — C6291 Malignant neoplasm of right testis, unspecified whether descended or undescended: Secondary | ICD-10-CM

## 2014-08-11 NOTE — Progress Notes (Signed)
Weekly Management Note:  Site: Right hockey stick Current Dose:  2550  cGy Projected Dose: 2550  cGy followed by 3 fraction boost of 600 cGy in 3 sessions  Narrative: The patient is seen today for routine under treatment assessment. CBCT/MVCT images/port films were reviewed. The chart was reviewed.   He still doing well and he does take Compazine as premedication.  No recent nausea or vomiting.  Physical Examination: There were no vitals filed for this visit..  Weight:  .  No change.  No abdominal hair loss.  Impression: Tolerating radiation therapy well.  He will finish his radiation therapy this Thursday.  Plan: Continue radiation therapy as planned.

## 2014-08-11 NOTE — Progress Notes (Signed)
Mr. Ruffins has received 15 fractions.  He denies any nausea nor pain today.  Premedicates daily with compazine 10 mg po at least 45 minutes prior to treatment.  Denies any fatigue.

## 2014-08-12 ENCOUNTER — Ambulatory Visit
Admission: RE | Admit: 2014-08-12 | Discharge: 2014-08-12 | Disposition: A | Discharge status: Home / Self Care | Payer: Medicare Other | Source: Ambulatory Visit | Attending: Radiation Oncology | Admitting: Radiation Oncology

## 2014-08-12 DIAGNOSIS — C6291 Malignant neoplasm of right testis, unspecified whether descended or undescended: Secondary | ICD-10-CM | POA: Diagnosis not present

## 2014-08-13 ENCOUNTER — Ambulatory Visit
Admission: RE | Admit: 2014-08-13 | Discharge: 2014-08-13 | Disposition: A | Discharge status: Home / Self Care | Payer: Medicare Other | Source: Ambulatory Visit | Attending: Radiation Oncology | Admitting: Radiation Oncology

## 2014-08-13 DIAGNOSIS — C6291 Malignant neoplasm of right testis, unspecified whether descended or undescended: Secondary | ICD-10-CM | POA: Diagnosis not present

## 2014-08-14 ENCOUNTER — Encounter: Payer: Self-pay | Admitting: Radiation Oncology

## 2014-08-14 ENCOUNTER — Ambulatory Visit
Admission: RE | Admit: 2014-08-14 | Discharge: 2014-08-14 | Disposition: A | Discharge status: Home / Self Care | Payer: Medicare Other | Source: Ambulatory Visit | Attending: Radiation Oncology | Admitting: Radiation Oncology

## 2014-08-14 VITALS — BP 119/63 | HR 78 | Temp 97.6°F | Resp 20 | Wt 154.1 lb

## 2014-08-14 DIAGNOSIS — C6291 Malignant neoplasm of right testis, unspecified whether descended or undescended: Secondary | ICD-10-CM | POA: Diagnosis not present

## 2014-08-14 NOTE — Progress Notes (Signed)
Patient completed 18/18  No bladder issues, c/o" yesterday of abdomen mid waves of fire and ice " no nausea, regular bowel movements, resolved on its own, no nausea no fatigue 9:24 AM BP 119/63 mmHg  Pulse 78  Temp(Src) 97.6 F (36.4 C) (Oral)  Resp 20  Wt 154 lb 1.6 oz (69.899 kg)\ Wt Readings from Last 3 Encounters:  08/14/14 154 lb 1.6 oz (69.899 kg)  08/04/14 154 lb 14.4 oz (70.262 kg)  07/28/14 151 lb 14.4 oz (68.901 kg)  Gaspar Garbe, RN II Rad/Onc

## 2014-08-14 NOTE — Progress Notes (Signed)
Weekly Management Note:  Site: Abdomen/boost Current Dose:  600  cGy Projected Dose: 600  cGy  Narrative: The patient is seen today for routine under treatment assessment. CBCT/MVCT images/port films were reviewed. The chart was reviewed.   Radiation therapy is completed today.  He does report a sensation of waves of "fire and ice" in the abdomen beginning yesterday evening and resolving on its own by this morning.  He is not sure if this is related to being in the heat  yesterday.  Physical Examination:  Filed Vitals:   08/14/14 0919  BP: 119/63  Pulse: 78  Temp: 97.6 F (36.4 C)  Resp: 20  .  Weight: 154 lb 1.6 oz (69.899 kg).  Abdominal examination is benign.  Impression: Ration therapy is completed.  I am not clear as to the etiology of his symptoms from yesterday evening/night.  He is to contact me if the symptoms recur.  Plan: Follow-up visit with me in one month.

## 2014-08-14 NOTE — Progress Notes (Signed)
[facility] End of Treatment Note  Name:Derrius Morrell  Date: 08/14/2014 XMI:[id number] DOB:February 11, 1976   Status:outpatient    CC: Jenny Reichmann, MD  Dr. Franchot Gallo  REFERRING PHYSICIAN:  Dr. Franchot Gallo   DIAGNOSIS: Recurrent seminoma of the right testis, stage IIA (N1), initial stage I B (T2 NX M0)   INDICATION FOR TREATMENT: Curative   TREATMENT DATES: 07/22/2014 through 08/14/2014                          SITE/DOSE:   Right hockey stick (periaortic/right iliac lymph nodes) 2550 cGy in 15 sessions, para aortic lymph node boost 60 cGy in 3 sessions                         BEAMS/ENERGY: 15 MV photons, parallel opposed anterior-posterior fields for his entire course of therapy.                  NARRATIVE:  Mr. Vivas had nausea and vomiting following his first treatment, but this was well controlled with prophylactic Compazine by mouth given before each treatment.  His weight remained stable.                          PLAN: Routine followup in one month. Patient instructed to call if questions or worsening complaints in interim.

## 2014-10-01 ENCOUNTER — Encounter: Payer: Self-pay | Admitting: Radiation Oncology

## 2014-10-07 ENCOUNTER — Ambulatory Visit
Admission: RE | Admit: 2014-10-07 | Discharge: 2014-10-07 | Disposition: A | Discharge status: Home / Self Care | Payer: Medicare Other | Source: Ambulatory Visit | Attending: Radiation Oncology | Admitting: Radiation Oncology

## 2014-10-07 ENCOUNTER — Encounter: Payer: Self-pay | Admitting: Radiation Oncology

## 2014-10-07 VITALS — BP 120/68 | HR 65 | Temp 97.8°F | Ht 60.0 in | Wt 156.3 lb

## 2014-10-07 DIAGNOSIS — C6291 Malignant neoplasm of right testis, unspecified whether descended or undescended: Secondary | ICD-10-CM

## 2014-10-07 HISTORY — DX: Personal history of irradiation: Z92.3

## 2014-10-07 NOTE — Progress Notes (Signed)
CC: Dr. Nena Jordan, Dr. Franchot Gallo  Mr. [name] returns today approximately 6 weeks following completion of radiation therapy to his abdomen/right pelvis (right hockey stick) in the management of his recurrent seminoma of the right testis.  He is without complaints today.  He recently returned from the [facility].  He has not yet seen Dr. Diona Fanti.  His appetite is good and his weight is stable.  Physical examination: Alert and oriented. Filed Vitals:   10/07/14 1048  BP: 120/68  Pulse: 65  Temp: 97.8 F (36.6 C)   Head and neck examination: Grossly unremarkable.  Nodes: There is no palpable cervical, supraclavicular, infraclavicular, or inguinal/femoral lymphadenopathy.  Chest: Lungs clear.  Abdomen without masses organomegaly.  Left testis is normal to palpation.  Extremities: Without edema.  Impression: Satisfactory progress.  No sequelae from his radiation therapy.  The family understands that he will need to have a follow-up CT scan was abdomen through Dr. Diona Fanti.  Dr. Diona Fanti will provide his future surveillance.  He should be seen by Dr. Diona Fanti no later than October.  Plan: One more follow-up visit with me which will be in November after he's had a CT scan with Dr. Diona Fanti. Zachary Wheeler

## 2014-10-07 NOTE — Progress Notes (Signed)
Mr. Zachary Wheeler here for reassessment s/p XRT to the right testis which completed on 08/14/14.  He denies any pain or voiding issues.

## 2014-10-10 ENCOUNTER — Telehealth: Payer: Self-pay | Admitting: *Deleted

## 2014-10-10 NOTE — Telephone Encounter (Signed)
Compazine prescription d/c'ed by Dr. Valere Dross and pharmacy called as prompted via in-basket

## 2014-11-11 ENCOUNTER — Ambulatory Visit (INDEPENDENT_AMBULATORY_CARE_PROVIDER_SITE_OTHER): Payer: Medicare Other

## 2014-11-11 DIAGNOSIS — Z23 Encounter for immunization: Secondary | ICD-10-CM

## 2014-11-25 ENCOUNTER — Encounter: Payer: Self-pay | Admitting: Emergency Medicine

## 2014-12-29 ENCOUNTER — Ambulatory Visit (HOSPITAL_COMMUNITY)
Admission: RE | Admit: 2014-12-29 | Discharge: 2014-12-29 | Disposition: A | Discharge status: Home / Self Care | Payer: Medicare Other | Source: Ambulatory Visit | Attending: Urology | Admitting: Urology

## 2014-12-29 ENCOUNTER — Other Ambulatory Visit: Payer: Self-pay | Admitting: Urology

## 2014-12-29 DIAGNOSIS — C6290 Malignant neoplasm of unspecified testis, unspecified whether descended or undescended: Secondary | ICD-10-CM

## 2015-01-07 ENCOUNTER — Ambulatory Visit
Admission: RE | Admit: 2015-01-07 | Discharge: 2015-01-07 | Disposition: A | Discharge status: Home / Self Care | Payer: Medicare Other | Source: Ambulatory Visit | Attending: Radiation Oncology | Admitting: Radiation Oncology

## 2015-01-07 ENCOUNTER — Encounter: Payer: Self-pay | Admitting: Radiation Oncology

## 2015-01-07 VITALS — BP 106/63 | HR 65 | Temp 97.9°F | Ht 60.0 in | Wt 156.4 lb

## 2015-01-07 DIAGNOSIS — C6291 Malignant neoplasm of right testis, unspecified whether descended or undescended: Secondary | ICD-10-CM

## 2015-01-07 NOTE — Progress Notes (Signed)
Mr. rachael schickling returns for reassessment s/p xrt to his right testis which completed on 08/14/14.  He denies any pain at this time nor any changes in urination pattern.

## 2015-01-07 NOTE — Progress Notes (Signed)
CC: Dr. Franchot Gallo  Follow-up note:  Nirvan returns today approximately 5 months following completion of radiation therapy to his periaortic/right iliac lymph nodes in the management of his recurrent seminoma of the right testis involving the right periaortic region.  He is without complaints today.  He remains active.  He is following Dr. Alan Ripper protocol for follow-up with chest x-rays, CT scans, and blood work.  His chest x-ray on November 7 was without evidence for metastatic disease.  I understand that he also had CT scans which I have not yet reviewed.  I  understand that there was no evidence for persistent/metastatic disease.  Physical examination: Alert and oriented. Filed Vitals:   01/07/15 0954  BP: 106/63  Pulse: 65  Temp: 97.9 F (36.6 C)   He is not examined today.  Impression: Satisfactory progress.  He is being followed closely by Dr. Diona Fanti with follow-up scans.  There is no need for follow-up here provided that he maintains his follow-up with Dr. Diona Fanti.  Plan: As above.  I wished him well.

## 2015-03-11 ENCOUNTER — Ambulatory Visit (HOSPITAL_COMMUNITY)
Admission: RE | Admit: 2015-03-11 | Discharge: 2015-03-11 | Disposition: A | Discharge status: Home / Self Care | Payer: Medicare Other | Source: Ambulatory Visit | Attending: Urology | Admitting: Urology

## 2015-03-11 ENCOUNTER — Other Ambulatory Visit: Payer: Self-pay | Admitting: Urology

## 2015-03-11 DIAGNOSIS — C6290 Malignant neoplasm of unspecified testis, unspecified whether descended or undescended: Secondary | ICD-10-CM

## 2015-03-31 ENCOUNTER — Encounter: Payer: Medicare Other | Admitting: Emergency Medicine

## 2015-04-07 ENCOUNTER — Encounter: Payer: Medicare Other | Admitting: Emergency Medicine

## 2015-06-12 ENCOUNTER — Other Ambulatory Visit: Payer: Self-pay | Admitting: Urology

## 2015-06-12 ENCOUNTER — Ambulatory Visit (HOSPITAL_COMMUNITY)
Admission: RE | Admit: 2015-06-12 | Discharge: 2015-06-12 | Disposition: A | Discharge status: Home / Self Care | Payer: Medicare Other | Source: Ambulatory Visit | Attending: Urology | Admitting: Urology

## 2015-06-12 DIAGNOSIS — C6290 Malignant neoplasm of unspecified testis, unspecified whether descended or undescended: Secondary | ICD-10-CM | POA: Insufficient documentation

## 2015-06-25 ENCOUNTER — Telehealth: Payer: Self-pay | Admitting: *Deleted

## 2015-06-25 ENCOUNTER — Ambulatory Visit (INDEPENDENT_AMBULATORY_CARE_PROVIDER_SITE_OTHER): Payer: Medicare Other | Admitting: Emergency Medicine

## 2015-06-25 ENCOUNTER — Encounter: Payer: Self-pay | Admitting: Emergency Medicine

## 2015-06-25 VITALS — BP 108/76 | HR 70 | Temp 97.9°F | Resp 16 | Ht 60.5 in | Wt 152.4 lb

## 2015-06-25 DIAGNOSIS — Z0000 Encounter for general adult medical examination without abnormal findings: Secondary | ICD-10-CM | POA: Diagnosis not present

## 2015-06-25 DIAGNOSIS — C6291 Malignant neoplasm of right testis, unspecified whether descended or undescended: Secondary | ICD-10-CM | POA: Diagnosis not present

## 2015-06-25 DIAGNOSIS — J029 Acute pharyngitis, unspecified: Secondary | ICD-10-CM

## 2015-06-25 LAB — POCT RAPID STREP A (OFFICE): RAPID STREP A SCREEN: NEGATIVE

## 2015-06-25 LAB — POCT URINALYSIS DIP (MANUAL ENTRY)
Bilirubin, UA: NEGATIVE
Glucose, UA: NEGATIVE
Ketones, POC UA: NEGATIVE
Leukocytes, UA: NEGATIVE
NITRITE UA: NEGATIVE
PH UA: 6
PROTEIN UA: NEGATIVE
Spec Grav, UA: 1.02
Urobilinogen, UA: 0.2

## 2015-06-25 NOTE — Telephone Encounter (Signed)
Faxed signed authorization for release of recent lab tests, per Dr Everlene Farrier. Confirmation page received at 10:43 am.

## 2015-06-25 NOTE — Telephone Encounter (Signed)
Called patient's father to let him know strep test was negative, per Dr Everlene Farrier.

## 2015-06-25 NOTE — Progress Notes (Addendum)
By signing my name below, I, [name], attest that this documentation has been prepared under the direction and in the presence of Arlyss Queen, MD. Electronically Signed: [name], [place]. 06/25/2015 , 8:37 AM .  Patient was seen in room 21 .  Chief Complaint:  Chief Complaint  Patient presents with  . Annual Exam    HPI: Zachary Wheeler is a 40 y.o. male who has h/o trisomy reports to [facility] today for annual physical.   Testicular Cancer H/o testicular seminoma. He had radiation therapy from 07/22/14 through 08/14/14. He saw Dr. Diona Fanti recently and reports that his cancer is clear. He will follow up again in 3 months for recheck.   Sore Throat Patient noticed sore throat with some difficulty talking that started 3 days ago, "like a snake was wrapped around".   Family His family plans to travel to [place] in July. He's brought in by his father today.   Past Medical History  Diagnosis Date  . Trisomy 21   . Gallstones   . Hematuria   . Down syndrome     Trisomy 21  . History of glomerulonephritis nephrologist-  dr Justin Mend    2002 resolved without kidney damage  . Family history of anesthesia complication     mother has PONV  . Cancer ([place]) 12/02/13    seminoma  . Gall stones   . S/P radiation therapy 07/22/2014 through 08/14/2014     Right hockey stick (periaortic/right iliac lymph nodes) 2550 cGy in 15 sessions, para aortic lymph node boost 60 cGy in 3 sessions    Past Surgical History  Procedure Laterality Date  . Root canal    . Orchiectomy Right 12/02/2013    Procedure: INGUINAL ORCHIECTOMY;  Surgeon: Jorja Loa, MD;  Location: [facility];  Service: Urology;  Laterality: Right;   Social History   Social History  . Marital Status: Single    Spouse Name: N/A  . Number of Children: N/A  . Years of Education: 12   Occupational History  . janitor    Social  History Main Topics  . Smoking status: Never Smoker   . Smokeless tobacco: Not on file  . Alcohol Use: Yes     Comment: 1 beer on occas  . Drug Use: No  . Sexual Activity: Not on file   Other Topics Concern  . Not on file   Social History Narrative   Exercises regularly.   Family History  Problem Relation Age of Onset  . Hypertension Mother   . Hyperlipidemia Mother   . Hypertension Maternal Grandmother   . Emphysema Maternal Grandmother   . Hypertension Maternal Grandfather   . Alcohol abuse Maternal Grandfather   . Cancer Paternal Grandmother   . Congestive Heart Failure Paternal Grandfather    Allergies  Allergen Reactions  . Codeine Nausea Only  . Sulfa Antibiotics Rash    swelling  . Terbinafine Hcl Rash   Prior to Admission medications   Medication Sig Start Date End Date Taking? Authorizing Provider  fluticasone (FLONASE) 50 MCG/ACT nasal spray Place 1 spray into both nostrils daily.    Historical Provider, MD  ibuprofen (ADVIL,MOTRIN) 200 MG tablet Take 200 mg by mouth every 6 (six) hours as needed.    Historical Provider, MD  Loratadine (CLARITIN) 10 MG CAPS Take by mouth as needed.    Historical Provider, MD     ROS:  Constitutional: negative for fever, chills, night sweats, weight changes, or fatigue  HEENT: negative for vision changes, hearing loss, congestion, rhinorrhea, epistaxis, or sinus pressure; positive for sore throat Cardiovascular: negative for chest pain or palpitations Respiratory: negative for hemoptysis, wheezing, shortness of breath, or cough Abdominal: negative for abdominal pain, nausea, vomiting, diarrhea, or constipation Dermatological: negative for rash Neurologic: negative for headache, dizziness, or syncope All other systems reviewed and are otherwise negative with the exception to those above and in the HPI.  PHYSICAL EXAM: Filed Vitals:   06/25/15 0809  BP: 108/76  Pulse: 70  Temp: 97.9 F (36.6 C)  Resp: 16   Body mass  index is 29.26 kg/(m^2).   General: Alert, no acute distress Patient has trisomy appearance HEENT:  Normocephalic, atraumatic, oropharynx patent. Eye: [facility] Cardiovascular:  Regular rate and rhythm, no rubs murmurs or gallops.  No Carotid bruits, radial pulse intact. No pedal edema.  Respiratory: Clear to auscultation bilaterally.  No wheezes, rales, or rhonchi.  No cyanosis, no use of accessory musculature Abdominal: No organomegaly, abdomen is soft and non-tender, positive bowel sounds. No masses. Musculoskeletal: Gait intact. No edema.He has bilateral severe bunion deformities Skin: No rashes. Neurologic: Facial musculature symmetric. Psychiatric: Patient acts appropriately throughout our interaction.  Lymphatic: No cervical or submandibular lymphadenopathy Genitourinary/Anorectal: status post right orchiectomy   LABS: Results for orders placed or performed in visit on 06/25/15  POCT rapid strep A  Result Value Ref Range   Rapid Strep A Screen Negative Negative  POCT urinalysis dipstick  Result Value Ref Range   Color, UA yellow yellow   Clarity, UA clear clear   Glucose, UA negative negative   Bilirubin, UA negative negative   Ketones, POC UA negative negative   Spec Grav, UA 1.020    Blood, UA trace-intact (A) negative   pH, UA 6.0    Protein Ur, POC negative negative   Urobilinogen, UA 0.2    Nitrite, UA Negative Negative   Leukocytes, UA Negative Negative    EKG/XRAY:     ASSESSMENT/PLAN:  Patient looks great. He has an upcoming trip to [place] and [place]. He had removal of a right seminoma 2015. Subsequent developed metastatic disease treated with radiation therapy. Last chest x-ray was normal. He has close follow-up with Dr.Dahlstedt.referral made to Dr. Edilia Bo to be his PCP. I think they will be a good fit.I personally performed the services described in this documentation, which was scribed in my presence. The recorded information has been  reviewed and is accurate. Strep test negative.sore throat most likely secondary to allergies. Gross sideeffects, risk and benefits, and alternatives of medications d/w patient. Patient is aware that all medications have potential sideeffects and we are unable to predict every sideeffect or drug-drug interaction that may occur.  Arlyss Queen MD 06/25/2015 8:09 AM  esultand edema:

## 2015-06-25 NOTE — Patient Instructions (Signed)
     IF you received an x-ray today, you will receive an invoice from [facility]. Please contact [facility] at [phone] with questions or concerns regarding your invoice.   IF you received labwork today, you will receive an invoice from [facility]. Please contact [facility] at [phone] with questions or concerns regarding your invoice.   Our billing staff will not be able to assist you with questions regarding bills from these companies.  You will be contacted with the lab results as soon as they are available. The fastest way to get your results is to activate your My Chart account. Instructions are located on the last page of this paperwork. If you have not heard from us regarding the results in 2 weeks, please contact this office.      

## 2015-06-30 ENCOUNTER — Telehealth: Payer: Self-pay | Admitting: *Deleted

## 2015-06-30 NOTE — Telephone Encounter (Signed)
Faxed signed authorization to [facility] medical records for recent labs. Confirmation page received at 8:36 am.

## 2015-09-23 ENCOUNTER — Ambulatory Visit (HOSPITAL_COMMUNITY)
Admission: RE | Admit: 2015-09-23 | Discharge: 2015-09-23 | Disposition: A | Discharge status: Home / Self Care | Payer: Medicare Other | Source: Ambulatory Visit | Attending: Urology | Admitting: Urology

## 2015-09-23 ENCOUNTER — Other Ambulatory Visit: Payer: Self-pay | Admitting: Urology

## 2015-09-23 DIAGNOSIS — C6290 Malignant neoplasm of unspecified testis, unspecified whether descended or undescended: Secondary | ICD-10-CM

## 2015-09-29 ENCOUNTER — Ambulatory Visit (INDEPENDENT_AMBULATORY_CARE_PROVIDER_SITE_OTHER): Payer: Medicare Other | Admitting: Family Medicine

## 2015-09-29 ENCOUNTER — Encounter: Payer: Self-pay | Admitting: Family Medicine

## 2015-09-29 VITALS — BP 116/72 | HR 71 | Resp 12 | Ht 60.5 in | Wt 153.0 lb

## 2015-09-29 DIAGNOSIS — C6291 Malignant neoplasm of right testis, unspecified whether descended or undescended: Secondary | ICD-10-CM

## 2015-09-29 DIAGNOSIS — R310 Gross hematuria: Secondary | ICD-10-CM

## 2015-09-29 DIAGNOSIS — J309 Allergic rhinitis, unspecified: Secondary | ICD-10-CM | POA: Insufficient documentation

## 2015-09-29 NOTE — Telephone Encounter (Signed)
closed

## 2015-09-29 NOTE — Patient Instructions (Addendum)
A few things to remember from today's visit:   Seminoma of right testis ([place])  Allergic rhinitis, unspecified allergic rhinitis type   Zachary Wheeler, today we have followed on some of your chronic medical problems and they seem to be stable, so no changes in current management today.  Review medication list and be sure if is accurate.  -Remember a healthy diet and regular physical activity are very important for prevention as well as for well being; they also help with many chronic problems, decreasing the need of adding new medications and delaying or preventing possible complications.So continue healthy lifestyle.  Remember to arrange your follow up appt before leaving today. Please follow sooner than planned if a new concern arises.   Please be sure medication list is accurate. If a new problem present, please set up appointment sooner than planned today.

## 2015-09-29 NOTE — Progress Notes (Signed)
HPI:   [name] is a 40 y.o. male, who is here today to establish care with me.  Former PCP: Dr Zachary Wheeler. Last preventive routine visit: 03/2015.  Concerns today: none.  Hx of trisomy 6, he lives with parents, otherwise independent except for some IADL's and he does not drive.  He follows a healthy diet and exercises regularly. He has history of allergic rhinitis, takes Flonase intranasal and antihistaminic as needed.  History of right testicular seminoma,s/p right radical orchiectomy in 2015. CT scan of the abdomen/pelvis on 06/18/2014 showed a 1.7 cm enlarged aortocaval lymph node; Dx with recurrent seminoma with abdominal metastasis, treated with radiation therapy, which he has completed.  He follows with Dr. Diona Wheeler, urologists, q 3 months.   He also follows with nephrologist (Dr Zachary Wheeler), next appointment tomorrow. According to mother, he follows annually because history of gross hematuria. He has history of nephrolithiasis, he has not had an exacerbation in years.    Review of Systems  Constitutional: Negative for activity change, appetite change, fatigue and fever.  HENT: Negative for facial swelling, mouth sores, nosebleeds and trouble swallowing.   Eyes: Negative for redness and visual disturbance.  Respiratory: Negative for cough, shortness of breath and wheezing.   Cardiovascular: Negative for chest pain, palpitations and leg swelling.  Gastrointestinal: Negative for abdominal pain, blood in stool, nausea and vomiting.       No changes in bowel habits  Genitourinary: Negative for decreased urine volume, difficulty urinating, dysuria, hematuria and urgency.  Musculoskeletal: Negative for arthralgias, back pain and joint swelling.  Allergic/Immunologic: Positive for environmental allergies.  Neurological: Negative for seizures, syncope, weakness and headaches.  Psychiatric/Behavioral: Negative for confusion. The patient is not nervous/anxious.        Current Outpatient Prescriptions on File Prior to Visit  Medication Sig Dispense Refill  . fluticasone (FLONASE) 50 MCG/ACT nasal spray Place 1 spray into both nostrils daily.    Marland Kitchen ibuprofen (ADVIL,MOTRIN) 200 MG tablet Take 200 mg by mouth every 6 (six) hours as needed.    . Loratadine (CLARITIN) 10 MG CAPS Take by mouth as needed.     No current facility-administered medications on file prior to visit.      Past Medical History:  Diagnosis Date  . Allergy   . Cancer ([place]) 12/02/13   seminoma  . Chicken pox   . Down syndrome    Trisomy 21  . Family history of anesthesia complication    mother has PONV  . Gall stones   . Gallstones   . Hematuria   . History of glomerulonephritis nephrologist-  dr Zachary Wheeler   2002 resolved without kidney damage  . S/P radiation therapy 07/22/2014 through 08/14/2014    Right hockey stick (periaortic/right iliac lymph nodes) 2550 cGy in 15 sessions, para aortic lymph node boost 60 cGy in 3 sessions   . Trisomy 21    Allergies  Allergen Reactions  . Codeine Nausea Only  . Sulfa Antibiotics Rash    swelling  . Terbinafine Hcl Rash    Family History  Problem Relation Age of Onset  . Hypertension Mother   . Hyperlipidemia Mother   . Hypertension Maternal Grandmother   . Emphysema Maternal Grandmother   . Hypertension Maternal Grandfather   . Alcohol abuse Maternal Grandfather   . Cancer Paternal Grandmother   . Congestive Heart Failure Paternal Grandfather     Social History   Social History  . Marital status: Single    Spouse  name: N/A  . Number of children: N/A  . Years of education: 8   Occupational History  . janitor    Social History Main Topics  . Smoking status: Never Smoker  . Smokeless tobacco: None  . Alcohol use Yes     Comment: 1 beer on occas  . Drug use: No  . Sexual activity: Not Asked   Other Topics Concern  . None   Social  History Narrative   Exercises regularly.    Vitals:   09/29/15 1441  BP: 116/72  Pulse: 71  Resp: 12    Body mass index is 29.39 kg/m.    Physical Exam  Constitutional: He is oriented to person, place, and time. He appears well-developed. No distress.  HENT:  Head: Atraumatic.  Mouth/Throat: Oropharynx is clear and moist and mucous membranes are normal.  Eyes: Conjunctivae and EOM are normal. Pupils are equal, round, and reactive to light.  Cardiovascular: Normal rate and regular rhythm.   No murmur heard. Pulses:      Dorsalis pedis pulses are 2+ on the right side, and 2+ on the left side.  Respiratory: Effort normal and breath sounds normal. No respiratory distress.  GI: Soft. He exhibits no mass. There is no hepatomegaly. There is no tenderness.  Musculoskeletal: He exhibits no edema.  Lymphadenopathy:    He has no cervical adenopathy.  Neurological: He is alert and oriented to person, place, and time. He has normal strength.  Skin: Skin is warm. No erythema.  Psychiatric: He has a normal mood and affect.  Well groomed, good eye contact.      ASSESSMENT AND PLAN:     [name] was seen today for new patient (initial visit).  Diagnoses and all orders for this visit:  Allergic rhinitis, unspecified allergic rhinitis type  Stable. No changes in current management. F/U as needed. .  Seminoma of right testis [name])  Continue following with Dr. Diona Wheeler.  Hx of gross hematuria  As I understand this is the result for Mr. Zachary Wheeler to continue following up with nephrologist. He will keep appointment with nephrologist, Dr Zachary Wheeler, tomorrow.    -I think he can continue following up annually for his routine physical and as needed for acute illness.          Zachary Mairena G. [place], MD  [facility]. [place] office.

## 2015-09-30 LAB — CBC AND DIFFERENTIAL
HCT: 44 % (ref 41–53)
HEMOGLOBIN: 15.3 g/dL (ref 13.5–17.5)
Neutrophils Absolute: 4 /uL
Platelets: 222 10*3/uL (ref 150–399)
WBC: 5.5 10^3/mL

## 2015-09-30 LAB — HEPATIC FUNCTION PANEL
ALT: 15 U/L (ref 10–40)
AST: 15 U/L (ref 14–40)
BILIRUBIN, TOTAL: 0.3 mg/dL

## 2015-09-30 LAB — BASIC METABOLIC PANEL
BUN: 15 mg/dL (ref 4–21)
Creatinine: 0.9 mg/dL (ref 0.6–1.3)
Glucose: 80 mg/dL
Potassium: 3.9 mmol/L (ref 3.4–5.3)
SODIUM: 137 mmol/L (ref 137–147)

## 2015-10-01 ENCOUNTER — Encounter: Payer: Self-pay | Admitting: Family Medicine

## 2015-11-23 ENCOUNTER — Ambulatory Visit (INDEPENDENT_AMBULATORY_CARE_PROVIDER_SITE_OTHER): Payer: Medicare Other | Admitting: Urgent Care

## 2015-11-23 DIAGNOSIS — Z23 Encounter for immunization: Secondary | ICD-10-CM

## 2015-11-23 NOTE — Progress Notes (Signed)
Patient presented for flu shot only jp/cma

## 2015-12-28 ENCOUNTER — Ambulatory Visit (HOSPITAL_COMMUNITY)
Admission: RE | Admit: 2015-12-28 | Discharge: 2015-12-28 | Disposition: A | Discharge status: Home / Self Care | Payer: Medicare Other | Source: Ambulatory Visit | Attending: Urology | Admitting: Urology

## 2015-12-28 ENCOUNTER — Other Ambulatory Visit: Payer: Self-pay | Admitting: Urology

## 2015-12-28 DIAGNOSIS — C6290 Malignant neoplasm of unspecified testis, unspecified whether descended or undescended: Secondary | ICD-10-CM

## 2016-04-15 ENCOUNTER — Other Ambulatory Visit: Payer: Self-pay | Admitting: Urology

## 2016-04-15 ENCOUNTER — Ambulatory Visit (HOSPITAL_COMMUNITY)
Admission: RE | Admit: 2016-04-15 | Discharge: 2016-04-15 | Disposition: A | Discharge status: Home / Self Care | Payer: PPO | Source: Ambulatory Visit | Attending: Urology | Admitting: Urology

## 2016-04-15 DIAGNOSIS — C6200 Malignant neoplasm of unspecified undescended testis: Secondary | ICD-10-CM

## 2016-04-15 DIAGNOSIS — C6290 Malignant neoplasm of unspecified testis, unspecified whether descended or undescended: Secondary | ICD-10-CM | POA: Insufficient documentation

## 2016-04-27 DIAGNOSIS — C6211 Malignant neoplasm of descended right testis: Secondary | ICD-10-CM | POA: Diagnosis not present

## 2016-04-27 DIAGNOSIS — C6290 Malignant neoplasm of unspecified testis, unspecified whether descended or undescended: Secondary | ICD-10-CM | POA: Diagnosis not present

## 2016-05-22 IMAGING — CR DG CHEST 2V
2 series · 2 of 2 positions shown · non-contrast
Comparison: Contemporaneously CT 12/29/2014, prior chest x-ray
06/20/2014

CLINICAL DATA: 39-year-old male with a history of seminoma

EXAM:
CHEST - 2 VIEW

[w chest pa]
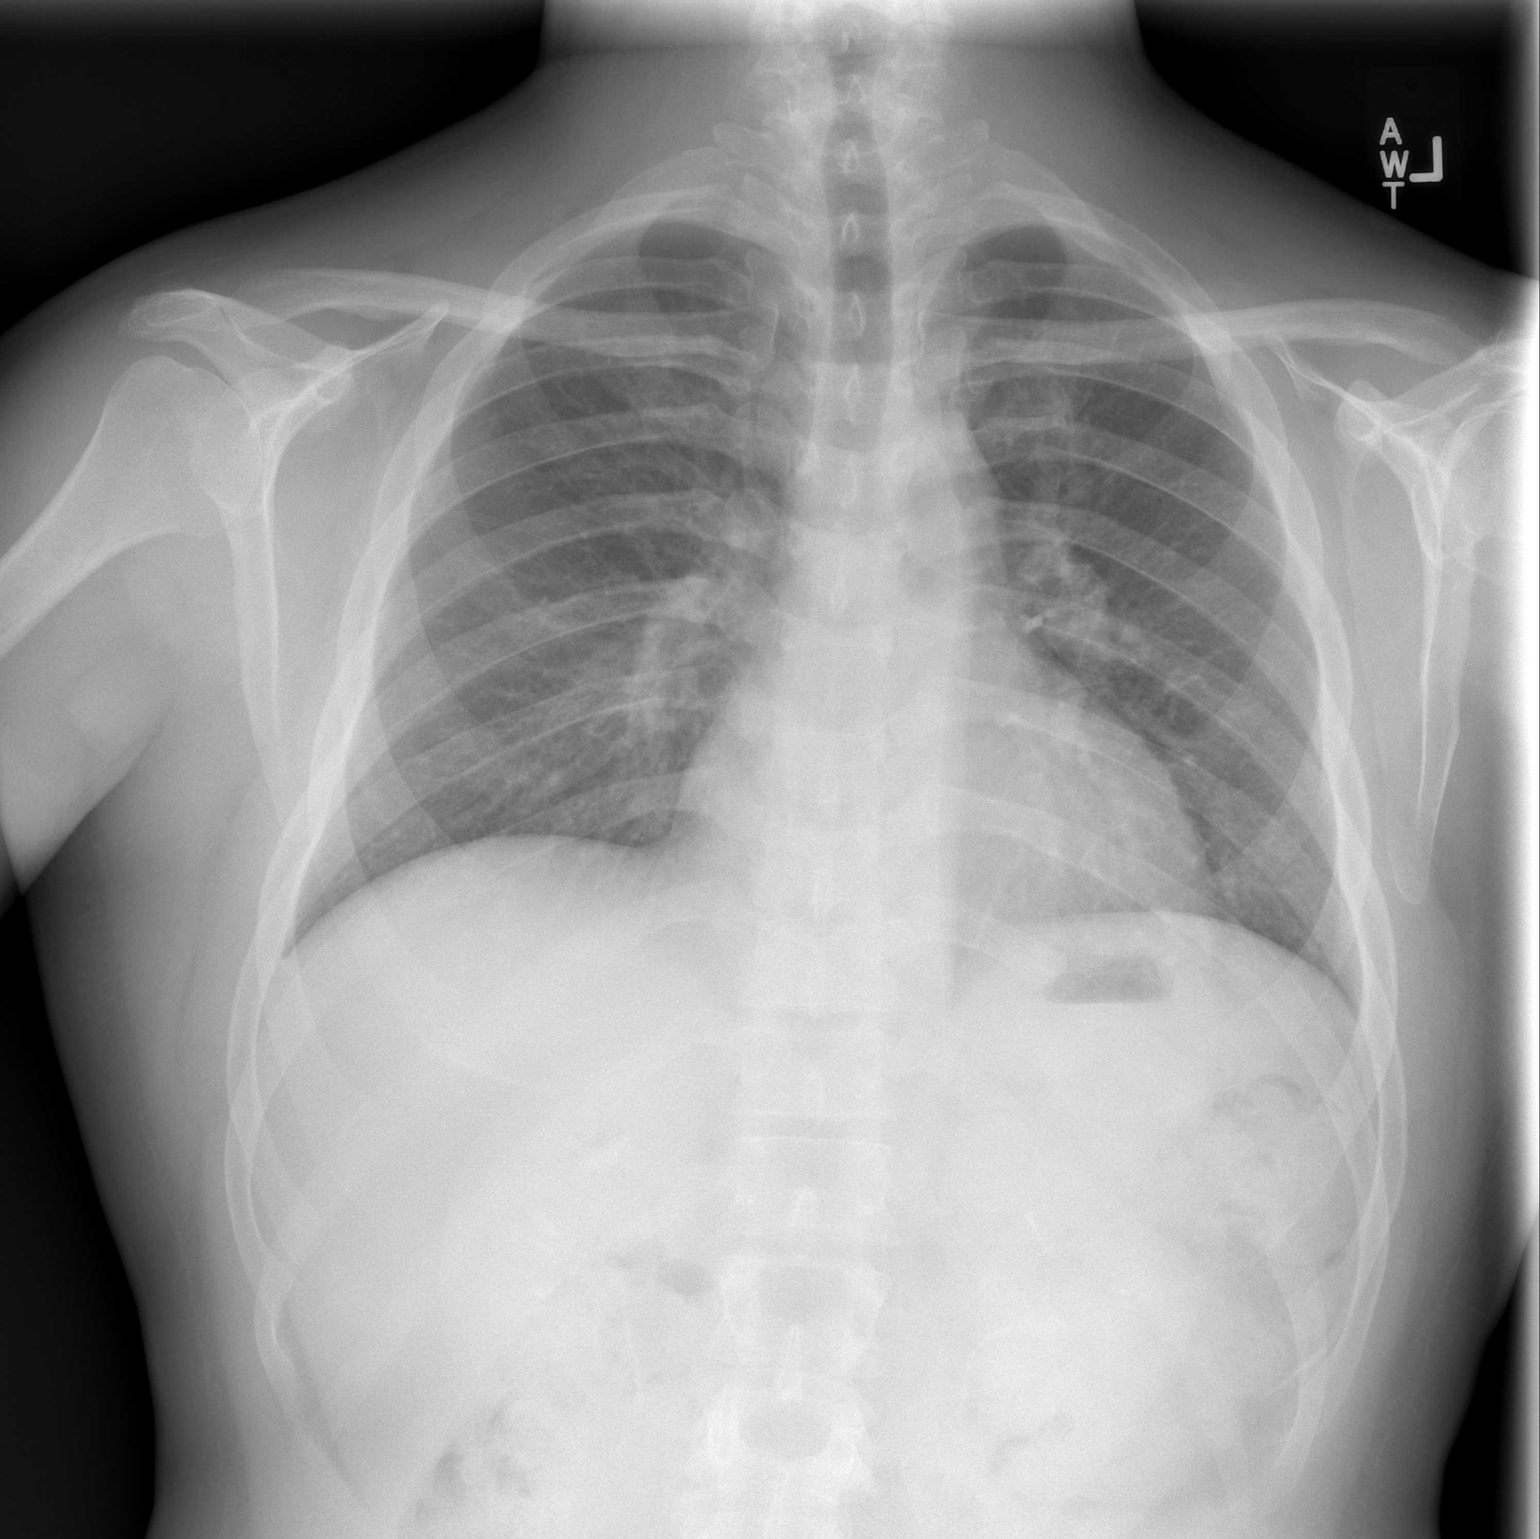

[w chest lat]
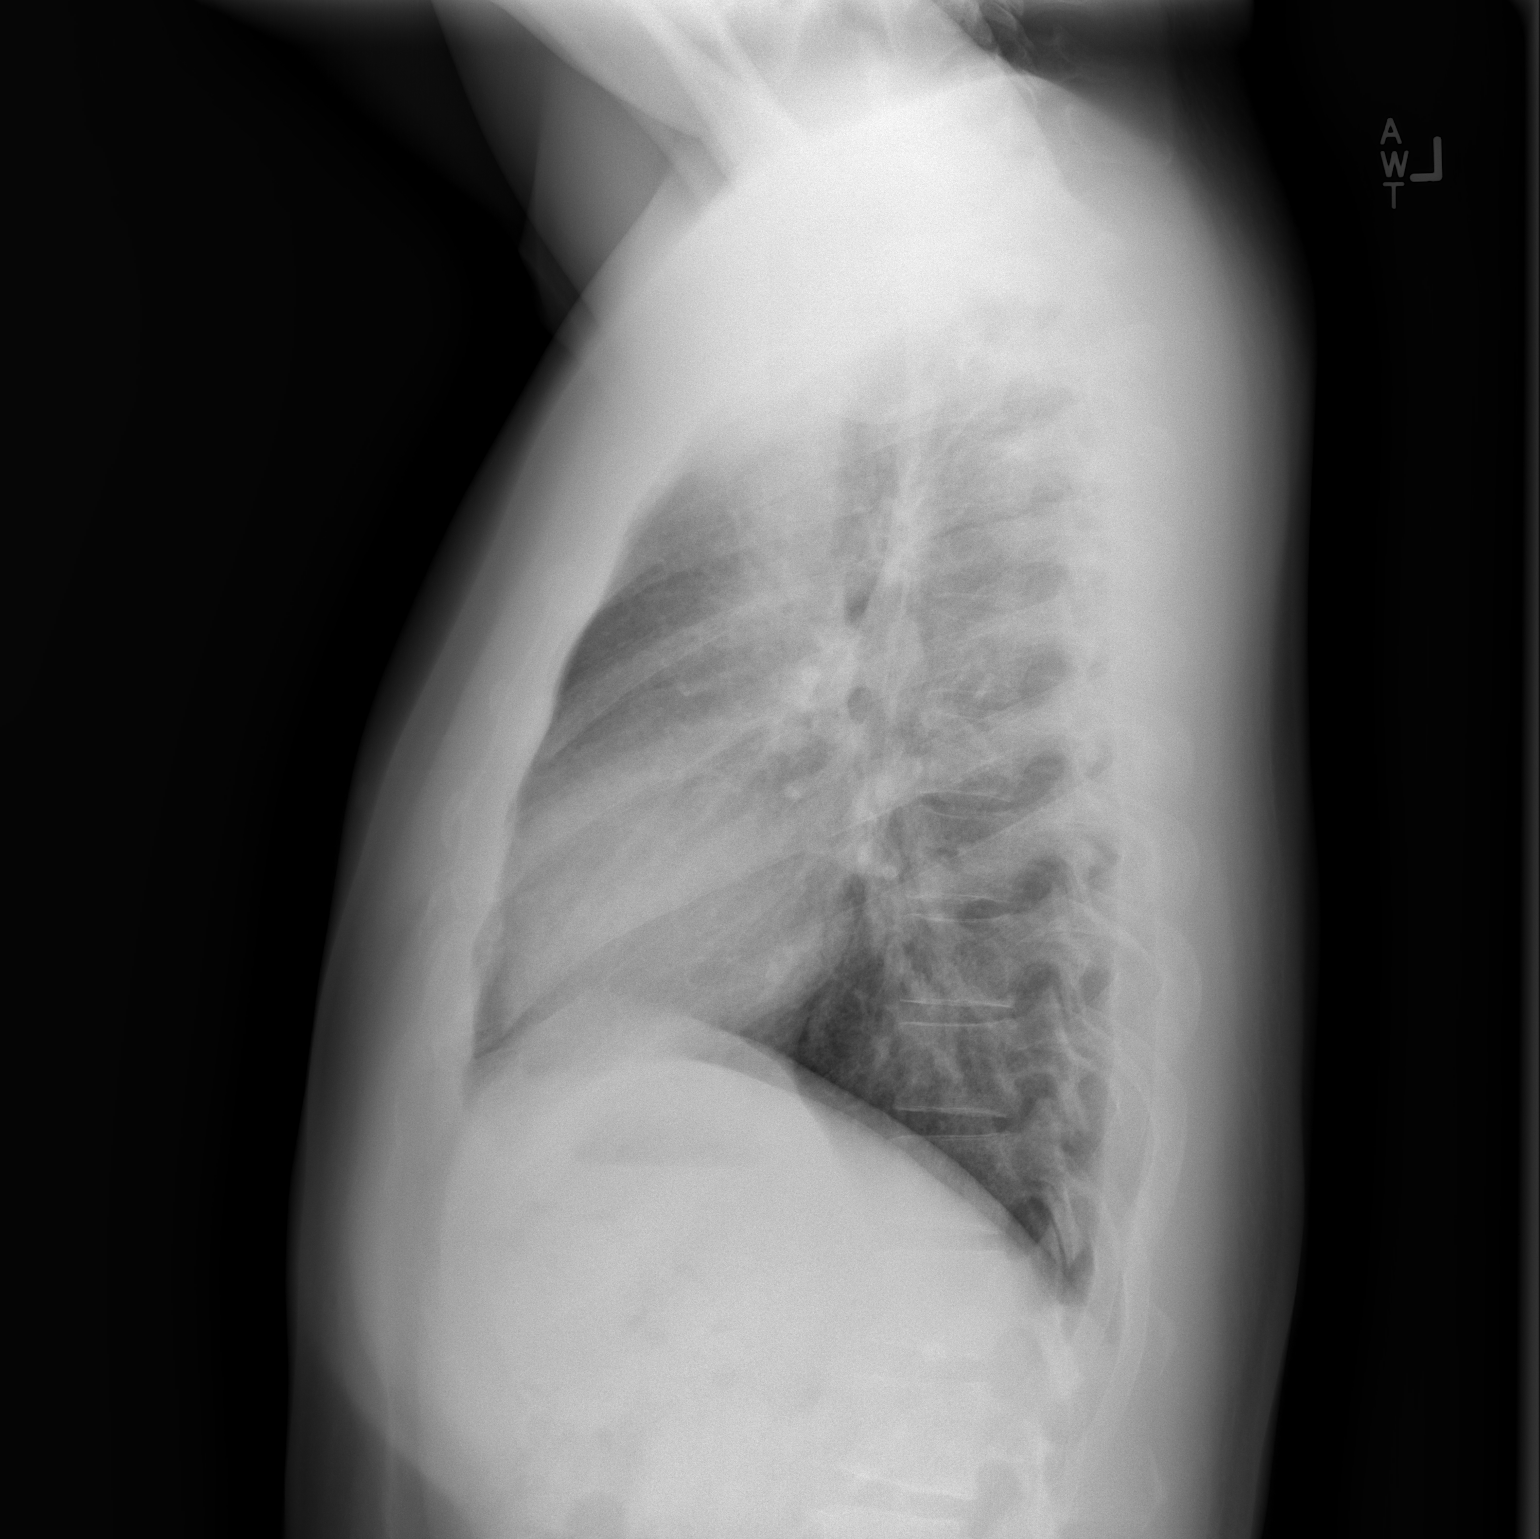

[2 of 2 positions shown; findings below may reference images not displayed]

FINDINGS: Cardiomediastinal silhouette projects within normal limits in size
and contour. Low lung volumes. No confluent airspace disease,
pneumothorax, or pleural effusion.

No displaced fracture.

Unremarkable appearance of the upper abdomen.
IMPRESSION: No radiographic evidence of acute cardiopulmonary disease.

## 2016-08-09 DIAGNOSIS — H11421 Conjunctival edema, right eye: Secondary | ICD-10-CM | POA: Diagnosis not present

## 2016-08-31 ENCOUNTER — Ambulatory Visit (HOSPITAL_COMMUNITY)
Admission: RE | Admit: 2016-08-31 | Discharge: 2016-08-31 | Disposition: A | Discharge status: Home / Self Care | Payer: PPO | Source: Ambulatory Visit | Attending: Urology | Admitting: Urology

## 2016-08-31 ENCOUNTER — Other Ambulatory Visit: Payer: Self-pay | Admitting: Urology

## 2016-08-31 DIAGNOSIS — C6290 Malignant neoplasm of unspecified testis, unspecified whether descended or undescended: Secondary | ICD-10-CM | POA: Diagnosis not present

## 2016-08-31 DIAGNOSIS — C6210 Malignant neoplasm of unspecified descended testis: Secondary | ICD-10-CM

## 2016-09-07 DIAGNOSIS — C6290 Malignant neoplasm of unspecified testis, unspecified whether descended or undescended: Secondary | ICD-10-CM | POA: Diagnosis not present

## 2016-09-21 ENCOUNTER — Other Ambulatory Visit: Payer: Medicare Other

## 2016-09-28 ENCOUNTER — Encounter: Payer: Medicare Other | Admitting: Family Medicine

## 2016-10-31 ENCOUNTER — Ambulatory Visit (INDEPENDENT_AMBULATORY_CARE_PROVIDER_SITE_OTHER): Payer: PPO | Admitting: Family Medicine

## 2016-10-31 ENCOUNTER — Encounter: Payer: Self-pay | Admitting: Family Medicine

## 2016-10-31 ENCOUNTER — Telehealth: Payer: Self-pay

## 2016-10-31 VITALS — BP 111/72 | HR 75 | Temp 97.5°F | Resp 18 | Ht 60.5 in | Wt 154.4 lb

## 2016-10-31 DIAGNOSIS — Z0000 Encounter for general adult medical examination without abnormal findings: Secondary | ICD-10-CM | POA: Diagnosis not present

## 2016-10-31 DIAGNOSIS — Z136 Encounter for screening for cardiovascular disorders: Secondary | ICD-10-CM

## 2016-10-31 DIAGNOSIS — Z23 Encounter for immunization: Secondary | ICD-10-CM

## 2016-10-31 DIAGNOSIS — K644 Residual hemorrhoidal skin tags: Secondary | ICD-10-CM

## 2016-10-31 DIAGNOSIS — Z1383 Encounter for screening for respiratory disorder NEC: Secondary | ICD-10-CM | POA: Diagnosis not present

## 2016-10-31 DIAGNOSIS — Z130 Encounter for screening for diseases of the blood and blood-forming organs and certain disorders involving the immune mechanism: Secondary | ICD-10-CM | POA: Diagnosis not present

## 2016-10-31 DIAGNOSIS — G3184 Mild cognitive impairment, so stated: Secondary | ICD-10-CM

## 2016-10-31 DIAGNOSIS — K625 Hemorrhage of anus and rectum: Secondary | ICD-10-CM

## 2016-10-31 DIAGNOSIS — Z1389 Encounter for screening for other disorder: Secondary | ICD-10-CM | POA: Diagnosis not present

## 2016-10-31 DIAGNOSIS — L299 Pruritus, unspecified: Secondary | ICD-10-CM

## 2016-10-31 DIAGNOSIS — Z1329 Encounter for screening for other suspected endocrine disorder: Secondary | ICD-10-CM | POA: Diagnosis not present

## 2016-10-31 DIAGNOSIS — R239 Unspecified skin changes: Secondary | ICD-10-CM

## 2016-10-31 LAB — POCT URINALYSIS DIP (MANUAL ENTRY)
Bilirubin, UA: NEGATIVE
Glucose, UA: NEGATIVE mg/dL
Ketones, POC UA: NEGATIVE mg/dL
Leukocytes, UA: NEGATIVE
NITRITE UA: NEGATIVE
PH UA: 7 (ref 5.0–8.0)
PROTEIN UA: NEGATIVE mg/dL
RBC UA: NEGATIVE
SPEC GRAV UA: 1.01 (ref 1.010–1.025)
UROBILINOGEN UA: 0.2 U/dL

## 2016-10-31 MED ORDER — HYDROCORTISONE ACE-PRAMOXINE 1-1 % RE CREA: 1.0000 "application " | TOPICAL_CREAM | Freq: Two times a day (BID) | RECTAL | 5 refills | Status: DC

## 2016-10-31 NOTE — Telephone Encounter (Signed)
Insurance does not cover the Proctocream and is $200,  but does cover just hydrocortisone cream 2.5%. Gave the verbal okay to make this change. Do you want this or does it have to be what you ordered ?

## 2016-10-31 NOTE — Patient Instructions (Addendum)
You have 2 non-thrombosed but slightly inflammed external hemorrhoids on the side that is closest to the scrotum. Apply the cream twice a day (after a bowel movement is preferred) until your symptoms are resolved. They will never FULLY go away and so might come back with certain conditions in which case you can again restart the cream.   IF you received an x-ray today, you will receive an invoice from [facility]. Please contact [facility] at [phone] with questions or concerns regarding your invoice.   IF you received labwork today, you will receive an invoice from [place]. Please contact [facility] at [phone] with questions or concerns regarding your invoice.   Our billing staff will not be able to assist you with questions regarding bills from these companies.  You will be contacted with the lab results as soon as they are available. The fastest way to get your results is to activate your My Chart account. Instructions are located on the last page of this paperwork. If you have not heard from [place] regarding the results in 2 weeks, please contact this office.    About Hemorrhoids  Hemorrhoids are swollen veins in the lower rectum and anus.  Also called piles, hemorrhoids are a common problem.  Hemorrhoids may be internal (inside the rectum) or external (around the anus).  Internal Hemorrhoids  Internal hemorrhoids are often painless, but they rarely cause bleeding.  The internal veins may stretch and fall down (prolapse) through the anus to the outside of the body.  The veins may then become irritated and painful.  External Hemorrhoids  External hemorrhoids can be easily seen or felt around the anal opening.  They are under the skin around the anus.  When the swollen veins are scratched or broken by straining, rubbing or wiping they sometimes bleed.  How Hemorrhoids Occur  Veins in the rectum and around the anus tend to swell under pressure.  Hemorrhoids can  result from increased pressure in the veins of your anus or rectum.  Some sources of pressure are:   Straining to have a bowel movement because of constipation  Waiting too long to have a bowel movement  Coughing and sneezing often  Sitting for extended periods of time, including on the toilet  Diarrhea  Obesity  Trauma or injury to the anus  Some liver diseases  Stress  Family history of hemorrhoids  Pregnancy  Pregnant women should try to avoid becoming constipated, because they are more likely to have hemorrhoids during pregnancy.  In the last trimester of pregnancy, the enlarged uterus may press on blood vessels and causes hemorrhoids.  In addition, the strain of childbirth sometimes causes hemorrhoids after the birth.  Symptoms of Hemorrhoids  Some symptoms of hemorrhoids include:  Swelling and/or a tender lump around the anus  Itching, mild burning and bleeding around the anus  Painful bowel movements with or without constipation  Bright red blood covering the stool, on toilet paper or in the toilet bowel.   Symptoms usually go away within a few days.  Always talk to your doctor about any bleeding to make sure it is not from some other causes.  Diagnosing and Treating Hemorrhoids  Diagnosis is made by an examination by your healthcare provider.  Special test can be performed by your doctor.    Most cases of hemorrhoids can be treated with:  High-fiber diet: Eat more high-fiber foods, which help prevent constipation.  Ask for more detailed fiber information on types and sources of fiber  from your healthcare provider.  Fluids: Drink plenty of water.  This helps soften bowel movements so they are easier to pass.  Sitz baths and cold packs: Sitting in lukewarm water two or three times a day for 15 minutes cleases the anal area and may relieve discomfort.  If the water is too hot, swelling around the anus will get worse.  Placing a cloth-covered ice pack on the anus  for ten minutes four times a day can also help reduce selling.  Gently pushing a prolapsed hemorrhoid back inside after the bath or ice pack can be helpful.  Medications: For mild discomfort, your healthcare provider may suggest over-the-counter pain medication or prescribe a cream or ointment for topical use.  The cream may contain witch hazel, zinc oxide or petroleum jelly.  Medicated suppositories are also a treatment option.  Always consult your doctor before applying medications or creams.  Procedures and surgeries: There are also a number of procedures and surgeries to shrink or remove hemorrhoids in more serious cases.  Talk to your physician about these options.  You can often prevent hemorrhoids or keep them from becoming worse by maintaining a healthy lifestyle.  Eat a fiber-rich diet of fruits, vegetables and whole grains.  Also, drink plenty of water and exercise regularly.   2007, [employer] Doc.30  Health Maintenance, Male A healthy lifestyle and preventive care is important for your health and wellness. Ask your health care provider about what schedule of regular examinations is right for you. What should I know about weight and diet? Eat a Healthy Diet  Eat plenty of vegetables, fruits, whole grains, low-fat dairy products, and lean protein.  Do not eat a lot of foods high in solid fats, added sugars, or salt.  Maintain a Healthy Weight Regular exercise can help you achieve or maintain a healthy weight. You should:  Do at least 150 minutes of exercise each week. The exercise should increase your heart rate and make you sweat (moderate-intensity exercise).  Do strength-training exercises at least twice a week.  Watch Your Levels of Cholesterol and Blood Lipids  Have your blood tested for lipids and cholesterol every 5 years starting at 40 years of age. If you are at high risk for heart disease, you should start having your blood tested when you are 40 years  old. You may need to have your cholesterol levels checked more often if: ? Your lipid or cholesterol levels are high. ? You are older than 40 years of age. ? You are at high risk for heart disease.  What should I know about cancer screening? Many types of cancers can be detected early and may often be prevented. Lung Cancer  You should be screened every year for lung cancer if: ? You are a current smoker who has smoked for at least 30 years. ? You are a former smoker who has quit within the past 15 years.  Talk to your health care provider about your screening options, when you should start screening, and how often you should be screened.  Colorectal Cancer  Routine colorectal cancer screening usually begins at 40 years of age and should be repeated every 5-10 years until you are 40 years old. You may need to be screened more often if early forms of precancerous polyps or small growths are found. Your health care provider may recommend screening at an earlier age if you have risk factors for colon cancer.  Your health care provider may recommend using home test  kits to check for hidden blood in the stool.  A small camera at the end of a tube can be used to examine your colon (sigmoidoscopy or colonoscopy). This checks for the earliest forms of colorectal cancer.  Prostate and Testicular Cancer  Depending on your age and overall health, your health care provider may do certain tests to screen for prostate and testicular cancer.  Talk to your health care provider about any symptoms or concerns you have about testicular or prostate cancer.  Skin Cancer  Check your skin from head to toe regularly.  Tell your health care provider about any new moles or changes in moles, especially if: ? There is a change in a mole's size, shape, or color. ? You have a mole that is larger than a pencil eraser.  Always use sunscreen. Apply sunscreen liberally and repeat throughout the day.  Protect  yourself by wearing long sleeves, pants, a wide-brimmed hat, and sunglasses when outside.  What should I know about heart disease, diabetes, and high blood pressure?  If you are 4-21 years of age, have your blood pressure checked every 3-5 years. If you are 59 years of age or older, have your blood pressure checked every year. You should have your blood pressure measured twice-once when you are at a hospital or clinic, and once when you are not at a hospital or clinic. Record the average of the two measurements. To check your blood pressure when you are not at a hospital or clinic, you can use: ? An automated blood pressure machine at a pharmacy. ? A home blood pressure monitor.  Talk to your health care provider about your target blood pressure.  If you are between 31-45 years old, ask your health care provider if you should take aspirin to prevent heart disease.  Have regular diabetes screenings by checking your fasting blood sugar level. ? If you are at a normal weight and have a low risk for diabetes, have this test once every three years after the age of 51. ? If you are overweight and have a high risk for diabetes, consider being tested at a younger age or more often.  A one-time screening for abdominal aortic aneurysm (AAA) by ultrasound is recommended for men aged 9-75 years who are current or former smokers. What should I know about preventing infection? Hepatitis B If you have a higher risk for hepatitis B, you should be screened for this virus. Talk with your health care provider to find out if you are at risk for hepatitis B infection. Hepatitis C Blood testing is recommended for:  Everyone born from [birth year] through 1964.  Anyone with known risk factors for hepatitis C.  Sexually Transmitted Diseases (STDs)  You should be screened each year for STDs including gonorrhea and chlamydia if: ? You are sexually active and are younger than 40 years of age. ? You are older than 40  years of age and your health care provider tells you that you are at risk for this type of infection. ? Your sexual activity has changed since you were last screened and you are at an increased risk for chlamydia or gonorrhea. Ask your health care provider if you are at risk.  Talk with your health care provider about whether you are at high risk of being infected with HIV. Your health care provider may recommend a prescription medicine to help prevent HIV infection.  What else can I do?  Schedule regular health, dental, and eye exams.  Stay current with your vaccines (immunizations).  Do not use any tobacco products, such as cigarettes, chewing tobacco, and e-cigarettes. If you need help quitting, ask your health care provider.  Limit alcohol intake to no more than 2 drinks per day. One drink equals 12 ounces of beer, 5 ounces of wine, or 1 ounces of hard liquor.  Do not use street drugs.  Do not share needles.  Ask your health care provider for help if you need support or information about quitting drugs.  Tell your health care provider if you often feel depressed.  Tell your health care provider if you have ever been abused or do not feel safe at home. This information is not intended to replace advice given to you by your health care provider. Make sure you discuss any questions you have with your health care provider. Document Released: 08/06/2007 Document Revised: 10/07/2015 Document Reviewed: 11/11/2014 Elsevier Interactive Patient Education  [employer].

## 2016-10-31 NOTE — Telephone Encounter (Signed)
Change is fine. Thanks.

## 2016-10-31 NOTE — Progress Notes (Signed)
Subjective:  By signing my name below, I, [name], attest that this documentation has been prepared under the direction and in the presence of Delman Cheadle, MD. Electronically Signed: [name], [place]. 10/31/2016 , 11:51 AM .  Patient was seen in Room 1 .   Patient ID: Zachary Wheeler, male    DOB: 1976/02/01, 41 y.o.   MRN: [id number] Chief Complaint  Patient presents with  . Annual Exam    CPE    HPI He was brought in by his mother today.   Primary Preventative Screenings: Prostate Cancer: Not indicated due to age - followed now annually by urology.  STI screening: Colorectal Cancer: Not indicated due to age.  Tobacco use/AAA/Lung/EtOH/Illicit substances:  Cardiac: Weight/Blood sugar/Diet/Exercise: he notes having good appetite. For exercise, he's been going to [employer], which works on cardio and core work. Occasionally, on Fridays, they have Zumba sessions. He's also been walking his sister's dog (which is a Therapist, occupational mix).  OTC/Vit/Supp/Herbal: Dentist/Optho: He sees dentist twice a year regularly. He's followed by eye doctor, seen once a year. He denies glaucoma or any concerns.  Immunizations:  Immunization History  Administered Date(s) Administered  . Influenza Split 11/25/2010, 11/10/2011  . Influenza,inj,Quad PF,6+ Mos 11/13/2012, 11/12/2013, 11/11/2014, 11/23/2015  . Tdap 02/08/2011   He received flu shot today.   Family history Paternal grandmother - passed away at 38 years old, cancers Paternal grandfather - passed away at 11 years old, colon cancer and CHF  Seasonal Allergies He takes Flonase and Claritin.   Rash - rectal area He reports rash around rectum, intermittently for 1-2 months. He mentions rash worse with sweat and heat. He noticed some bleeding when he wipes and also some blood on his underwear. He used to do more weight lifting previously. He denies hard or straining stools. He denies constipation.   Fungal Toenail He  sprays his toenails with tinactin spray with some improvement, once in the morning and once at night. He denies seeing foot doctor.   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 He saw Dr. Diona Fanti recently and reports that his cancer is clear. Next follow up in 6 months.   Past Medical History:  Diagnosis Date  . Allergy   . Cancer ([place]) 12/02/13   seminoma  . Chicken pox   . Down syndrome    Trisomy 21  . Family history of anesthesia complication    mother has PONV  . Gall stones   . Gallstones   . Hematuria   . History of glomerulonephritis nephrologist-  dr Justin Mend   2002 resolved without kidney damage  . S/P radiation therapy 07/22/2014 through 08/14/2014    Right hockey stick (periaortic/right iliac lymph nodes) 2550 cGy in 15 sessions, para aortic lymph node boost 60 cGy in 3 sessions   . Trisomy 21    Past Surgical History:  Procedure Laterality Date  . ORCHIECTOMY Right 12/02/2013   Procedure: INGUINAL ORCHIECTOMY;  Surgeon: Jorja Loa, MD;  Location: [facility];  Service: Urology;  Laterality: Right;  . ROOT CANAL     Current Outpatient Prescriptions on File Prior to Visit  Medication Sig Dispense Refill  . fluticasone (FLONASE) 50 MCG/ACT nasal spray Place 1 spray into both nostrils daily.    Marland Kitchen ibuprofen (ADVIL,MOTRIN) 200 MG tablet Take 200 mg by mouth every 6 (six) hours as needed.    . Loratadine (CLARITIN) 10 MG CAPS  Take by mouth as needed.     No current facility-administered medications on file prior to visit.    Allergies  Allergen Reactions  . Codeine Nausea Only  . Sulfa Antibiotics Rash    swelling  . Terbinafine Hcl Rash   Family History  Problem Relation Age of Onset  . Hypertension Mother   . Hyperlipidemia Mother   . Hypertension Maternal Grandmother   .  Emphysema Maternal Grandmother   . Hypertension Maternal Grandfather   . Alcohol abuse Maternal Grandfather   . Cancer Paternal Grandmother   . Congestive Heart Failure Paternal Grandfather    Social History   Social History  . Marital status: Single    Spouse name: N/A  . Number of children: N/A  . Years of education: 18   Occupational History  . janitor    Social History Main Topics  . Smoking status: Never Smoker  . Smokeless tobacco: Never Used  . Alcohol use Yes     Comment: 1 beer on occas  . Drug use: No  . Sexual activity: No   Other Topics Concern  . None   Social History Narrative   Exercises regularly.   Depression screen [facility] 2/9 10/31/2016 06/25/2015 01/07/2015 08/11/2014 07/01/2014  Decreased Interest 0 0 0 0 0  Down, Depressed, Hopeless 0 0 0 0 0  PHQ - 2 Score 0 0 0 0 0     Review of Systems  Constitutional: Negative for fatigue and unexpected weight change.  Eyes: Negative for visual disturbance.  Respiratory: Negative for cough, chest tightness and shortness of breath.   Cardiovascular: Negative for chest pain, palpitations and leg swelling.  Gastrointestinal: Positive for anal bleeding. Negative for abdominal pain, blood in stool and constipation.  Skin: Positive for rash.  Allergic/Immunologic: Positive for environmental allergies.  Neurological: Negative for dizziness, light-headedness and headaches.       Objective:   Physical Exam  Constitutional: He is oriented to person, place, and time. He appears well-developed and well-nourished. No distress.  HENT:  Head: Normocephalic and atraumatic.  Right Ear: Tympanic membrane normal.  Left Ear: Tympanic membrane normal.  Nose: Nose normal.  Mouth/Throat: Oropharynx is clear and moist and mucous membranes are normal.  Eyes: Pupils are equal, round, and reactive to light. EOM are normal.  Neck: Neck supple. No thyromegaly present.  Cardiovascular: Normal rate, regular rhythm, S1 normal, S2 normal  and normal heart sounds.  Exam reveals no gallop and no friction rub.   No murmur heard. Pulses:      Dorsalis pedis pulses are 2+ on the right side, and 2+ on the left side.  Pulmonary/Chest: Effort normal and breath sounds normal. No respiratory distress. He has no wheezes.  Abdominal: Soft. Bowel sounds are normal. He exhibits no distension. There is no hepatosplenomegaly. There is no tenderness.  Genitourinary: Rectal exam shows external hemorrhoid (at the base).  Musculoskeletal: Normal range of motion. He exhibits no edema.  Lymphadenopathy:    He has no cervical adenopathy.  Neurological: He is alert and oriented to person, place, and time.  Reflex Scores:      Patellar reflexes are 2+ on the right side and 2+ on the left side.      Achilles reflexes are 2+ on the right side and 2+ on the left side. Skin: Skin is warm and dry.  Psychiatric: He has a normal mood and affect. His behavior is normal.  Nursing note and vitals reviewed.   BP 111/72   Pulse  75   Temp (!) 97.5 F (36.4 C) (Oral)   Resp 18   Ht 5' 0.5" (1.537 m)   Wt 154 lb 6.4 oz (70 kg)   SpO2 97%   BMI 29.66 kg/m      Assessment & Plan:   1. Annual physical exam   2. Screening for cardiovascular, respiratory, and genitourinary diseases   3. Screening for deficiency anemia   4. Screening for thyroid disorder   5. Need for prophylactic vaccination and inoculation against influenza   6. External hemorrhoids     Orders Placed This Encounter  Procedures  . Flu Vaccine QUAD 36+ mos IM  . Comprehensive metabolic panel    Order Specific Question:   Has the patient fasted?    Answer:   Yes  . CBC with Differential/Platelet  . Lipid panel    Order Specific Question:   Has the patient fasted?    Answer:   Yes  . TSH  . POCT urinalysis dipstick    Meds ordered this encounter  Medications  . pramoxine-hydrocortisone (PROCTOCREAM-HC) 1-1 % rectal cream    Sig: Place 1 application rectally 2 (two) times  daily. Until sxs are resolved    Dispense:  30 g    Refill:  5    I personally performed the services described in this documentation, which was scribed in my presence. The recorded information has been reviewed and considered, and addended by me as needed.   Delman Cheadle, M.D.  Primary Care at [facility] [address] [place], [place] [address] [phone] phone [phone] fax  11/02/16 11:36 PM

## 2016-10-31 NOTE — Progress Notes (Deleted)
   Subjective:    Patient ID: Zachary Wheeler, male    DOB: 1976-01-21, 41 y.o.   MRN: [id number]  HPI  Primary Preventative Screenings: Prostate Cancer: Not indicated due to age - followed now annually by urology STI screening: Colorectal Cancer: Not indicated due to age Tobacco use/AAA/Lung/EtOH/Illicit substances:  Cardiac: Weight/Blood sugar/Diet/Exercise:: OTC/Vit/Supp/Herbal: Dentist/Optho: Immunizations:  Immunization History  Administered Date(s) Administered  . Influenza Split 11/25/2010, 11/10/2011  . Influenza,inj,Quad PF,6+ Mos 11/13/2012, 11/12/2013, 11/11/2014, 11/23/2015  . Tdap 02/08/2011     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 He saw Dr. Diona Fanti recently and reports that his cancer is clear.   Review of Systems     Objective:   Physical Exam        Assessment & Plan:   1. Annual physical exam   2. Screening for cardiovascular, respiratory, and genitourinary diseases   3. Screening for deficiency anemia   4. Screening for thyroid disorder   5. Need for prophylactic vaccination and inoculation against influenza   6. External hemorrhoids     Orders Placed This Encounter  Procedures  . Flu Vaccine QUAD 36+ mos IM  . Comprehensive metabolic panel    Order Specific Question:   Has the patient fasted?    Answer:   Yes  . CBC with Differential/Platelet  . Lipid panel    Order Specific Question:   Has the patient fasted?    Answer:   Yes  . TSH  . POCT urinalysis dipstick    Meds ordered this encounter  Medications  . pramoxine-hydrocortisone (PROCTOCREAM-HC) 1-1 % rectal cream    Sig: Place 1 application rectally 2 (two) times daily. Until sxs are resolved    Dispense:  30 g    Refill:  5    I personally performed the services described in this documentation, which was scribed in my presence. The recorded information has been reviewed  and considered, and addended by me as needed.   Delman Cheadle, M.D.  Primary Care at [facility] [address] [place], [place] [address] [phone] phone [phone] fax  11/02/16 11:36 PM

## 2016-11-01 LAB — CBC WITH DIFFERENTIAL/PLATELET
BASOS: 1 %
Basophils Absolute: 0.1 10*3/uL (ref 0.0–0.2)
EOS (ABSOLUTE): 0 10*3/uL (ref 0.0–0.4)
Eos: 1 %
Hematocrit: 47.5 % (ref 37.5–51.0)
Hemoglobin: 15.8 g/dL (ref 13.0–17.7)
IMMATURE GRANS (ABS): 0 10*3/uL (ref 0.0–0.1)
Immature Granulocytes: 0 %
LYMPHS ABS: 1.5 10*3/uL (ref 0.7–3.1)
Lymphs: 32 %
MCH: 31.9 pg (ref 26.6–33.0)
MCHC: 33.3 g/dL (ref 31.5–35.7)
MCV: 96 fL (ref 79–97)
MONOS ABS: 0.5 10*3/uL (ref 0.1–0.9)
Monocytes: 10 %
NEUTROS ABS: 2.7 10*3/uL (ref 1.4–7.0)
Neutrophils: 56 %
PLATELETS: 196 10*3/uL (ref 150–379)
RBC: 4.95 x10E6/uL (ref 4.14–5.80)
RDW: 14.2 % (ref 12.3–15.4)
WBC: 4.8 10*3/uL (ref 3.4–10.8)

## 2016-11-01 LAB — LIPID PANEL
Chol/HDL Ratio: 4.9 ratio (ref 0.0–5.0)
Cholesterol, Total: 191 mg/dL (ref 100–199)
HDL: 39 mg/dL — ABNORMAL LOW (ref >39)
LDL Calculated: 110 mg/dL — ABNORMAL HIGH (ref 0–99)
Triglycerides: 211 mg/dL — ABNORMAL HIGH (ref 0–149)
VLDL CHOLESTEROL CAL: 42 mg/dL — AB (ref 5–40)

## 2016-11-01 LAB — COMPREHENSIVE METABOLIC PANEL
A/G RATIO: 1.4 (ref 1.2–2.2)
ALT: 15 IU/L (ref 0–44)
AST: 17 IU/L (ref 0–40)
Albumin: 4.1 g/dL (ref 3.5–5.5)
Alkaline Phosphatase: 69 IU/L (ref 39–117)
BUN / CREAT RATIO: 13 (ref 9–20)
BUN: 13 mg/dL (ref 6–24)
Bilirubin Total: 0.5 mg/dL (ref 0.0–1.2)
CALCIUM: 9.3 mg/dL (ref 8.7–10.2)
CO2: 24 mmol/L (ref 20–29)
Chloride: 102 mmol/L (ref 96–106)
Creatinine, Ser: 0.97 mg/dL (ref 0.76–1.27)
GFR, EST AFRICAN AMERICAN: 112 mL/min/{1.73_m2} (ref >59)
GFR, EST NON AFRICAN AMERICAN: 97 mL/min/{1.73_m2} (ref >59)
Globulin, Total: 3 g/dL (ref 1.5–4.5)
Glucose: 92 mg/dL (ref 65–99)
POTASSIUM: 4.7 mmol/L (ref 3.5–5.2)
Sodium: 139 mmol/L (ref 134–144)
TOTAL PROTEIN: 7.1 g/dL (ref 6.0–8.5)

## 2016-11-01 LAB — TSH: TSH: 2.54 u[IU]/mL (ref 0.450–4.500)

## 2016-11-14 DIAGNOSIS — H04123 Dry eye syndrome of bilateral lacrimal glands: Secondary | ICD-10-CM | POA: Diagnosis not present

## 2017-01-27 DIAGNOSIS — T1511XA Foreign body in conjunctival sac, right eye, initial encounter: Secondary | ICD-10-CM | POA: Diagnosis not present

## 2017-01-27 DIAGNOSIS — H10411 Chronic giant papillary conjunctivitis, right eye: Secondary | ICD-10-CM | POA: Diagnosis not present

## 2017-02-24 DIAGNOSIS — H10411 Chronic giant papillary conjunctivitis, right eye: Secondary | ICD-10-CM | POA: Diagnosis not present

## 2017-02-24 DIAGNOSIS — H01025 Squamous blepharitis left lower eyelid: Secondary | ICD-10-CM | POA: Diagnosis not present

## 2017-02-24 DIAGNOSIS — H01021 Squamous blepharitis right upper eyelid: Secondary | ICD-10-CM | POA: Diagnosis not present

## 2017-02-24 DIAGNOSIS — H15102 Unspecified episcleritis, left eye: Secondary | ICD-10-CM | POA: Diagnosis not present

## 2017-02-24 DIAGNOSIS — H01022 Squamous blepharitis right lower eyelid: Secondary | ICD-10-CM | POA: Diagnosis not present

## 2017-02-24 DIAGNOSIS — H01024 Squamous blepharitis left upper eyelid: Secondary | ICD-10-CM | POA: Diagnosis not present

## 2017-02-27 ENCOUNTER — Other Ambulatory Visit: Payer: Self-pay | Admitting: Urology

## 2017-02-27 ENCOUNTER — Ambulatory Visit (HOSPITAL_COMMUNITY)
Admission: RE | Admit: 2017-02-27 | Discharge: 2017-02-27 | Disposition: A | Discharge status: Home / Self Care | Payer: PPO | Source: Ambulatory Visit | Attending: Urology | Admitting: Urology

## 2017-02-27 DIAGNOSIS — H15102 Unspecified episcleritis, left eye: Secondary | ICD-10-CM | POA: Diagnosis not present

## 2017-02-27 DIAGNOSIS — H01022 Squamous blepharitis right lower eyelid: Secondary | ICD-10-CM | POA: Diagnosis not present

## 2017-02-27 DIAGNOSIS — C6290 Malignant neoplasm of unspecified testis, unspecified whether descended or undescended: Secondary | ICD-10-CM | POA: Diagnosis not present

## 2017-02-27 DIAGNOSIS — H01021 Squamous blepharitis right upper eyelid: Secondary | ICD-10-CM | POA: Diagnosis not present

## 2017-02-27 DIAGNOSIS — H10411 Chronic giant papillary conjunctivitis, right eye: Secondary | ICD-10-CM | POA: Diagnosis not present

## 2017-02-27 DIAGNOSIS — H01025 Squamous blepharitis left lower eyelid: Secondary | ICD-10-CM | POA: Diagnosis not present

## 2017-02-27 DIAGNOSIS — R918 Other nonspecific abnormal finding of lung field: Secondary | ICD-10-CM | POA: Diagnosis not present

## 2017-02-27 DIAGNOSIS — H01024 Squamous blepharitis left upper eyelid: Secondary | ICD-10-CM | POA: Diagnosis not present

## 2017-03-06 DIAGNOSIS — C6290 Malignant neoplasm of unspecified testis, unspecified whether descended or undescended: Secondary | ICD-10-CM | POA: Diagnosis not present

## 2017-03-06 DIAGNOSIS — Z8547 Personal history of malignant neoplasm of testis: Secondary | ICD-10-CM | POA: Diagnosis not present

## 2017-03-27 DIAGNOSIS — H15102 Unspecified episcleritis, left eye: Secondary | ICD-10-CM | POA: Diagnosis not present

## 2017-03-27 DIAGNOSIS — H10411 Chronic giant papillary conjunctivitis, right eye: Secondary | ICD-10-CM | POA: Diagnosis not present

## 2017-03-27 DIAGNOSIS — H01024 Squamous blepharitis left upper eyelid: Secondary | ICD-10-CM | POA: Diagnosis not present

## 2017-03-27 DIAGNOSIS — H01025 Squamous blepharitis left lower eyelid: Secondary | ICD-10-CM | POA: Diagnosis not present

## 2017-03-27 DIAGNOSIS — H01022 Squamous blepharitis right lower eyelid: Secondary | ICD-10-CM | POA: Diagnosis not present

## 2017-03-27 DIAGNOSIS — H01021 Squamous blepharitis right upper eyelid: Secondary | ICD-10-CM | POA: Diagnosis not present

## 2017-08-30 ENCOUNTER — Ambulatory Visit (HOSPITAL_COMMUNITY)
Admission: RE | Admit: 2017-08-30 | Discharge: 2017-08-30 | Disposition: A | Discharge status: Home / Self Care | Payer: PPO | Source: Ambulatory Visit | Attending: Urology | Admitting: Urology

## 2017-08-30 ENCOUNTER — Other Ambulatory Visit: Payer: Self-pay | Admitting: Urology

## 2017-08-30 DIAGNOSIS — C6290 Malignant neoplasm of unspecified testis, unspecified whether descended or undescended: Secondary | ICD-10-CM

## 2017-08-30 DIAGNOSIS — Z8547 Personal history of malignant neoplasm of testis: Secondary | ICD-10-CM | POA: Diagnosis not present

## 2017-09-08 DIAGNOSIS — C6290 Malignant neoplasm of unspecified testis, unspecified whether descended or undescended: Secondary | ICD-10-CM | POA: Diagnosis not present

## 2017-09-08 DIAGNOSIS — Z8547 Personal history of malignant neoplasm of testis: Secondary | ICD-10-CM | POA: Diagnosis not present

## 2017-11-06 DIAGNOSIS — C6290 Malignant neoplasm of unspecified testis, unspecified whether descended or undescended: Secondary | ICD-10-CM | POA: Diagnosis not present

## 2017-11-06 DIAGNOSIS — E039 Hypothyroidism, unspecified: Secondary | ICD-10-CM | POA: Diagnosis not present

## 2017-11-06 DIAGNOSIS — Q909 Down syndrome, unspecified: Secondary | ICD-10-CM | POA: Diagnosis not present

## 2017-11-06 DIAGNOSIS — Z23 Encounter for immunization: Secondary | ICD-10-CM | POA: Diagnosis not present

## 2017-11-06 DIAGNOSIS — E785 Hyperlipidemia, unspecified: Secondary | ICD-10-CM | POA: Diagnosis not present

## 2017-11-06 DIAGNOSIS — N009 Acute nephritic syndrome with unspecified morphologic changes: Secondary | ICD-10-CM | POA: Diagnosis not present

## 2017-11-14 DIAGNOSIS — H01024 Squamous blepharitis left upper eyelid: Secondary | ICD-10-CM | POA: Diagnosis not present

## 2017-11-14 DIAGNOSIS — H01021 Squamous blepharitis right upper eyelid: Secondary | ICD-10-CM | POA: Diagnosis not present

## 2017-11-14 DIAGNOSIS — H15102 Unspecified episcleritis, left eye: Secondary | ICD-10-CM | POA: Diagnosis not present

## 2017-11-14 DIAGNOSIS — H01025 Squamous blepharitis left lower eyelid: Secondary | ICD-10-CM | POA: Diagnosis not present

## 2017-11-14 DIAGNOSIS — H01022 Squamous blepharitis right lower eyelid: Secondary | ICD-10-CM | POA: Diagnosis not present

## 2017-11-14 DIAGNOSIS — H10411 Chronic giant papillary conjunctivitis, right eye: Secondary | ICD-10-CM | POA: Diagnosis not present

## 2018-03-14 ENCOUNTER — Ambulatory Visit (HOSPITAL_COMMUNITY)
Admission: RE | Admit: 2018-03-14 | Discharge: 2018-03-14 | Disposition: A | Discharge status: Home / Self Care | Payer: PPO | Source: Ambulatory Visit | Attending: Urology | Admitting: Urology

## 2018-03-14 ENCOUNTER — Other Ambulatory Visit (HOSPITAL_COMMUNITY): Payer: Self-pay | Admitting: Urology

## 2018-03-14 DIAGNOSIS — C6200 Malignant neoplasm of unspecified undescended testis: Secondary | ICD-10-CM | POA: Insufficient documentation

## 2018-03-14 DIAGNOSIS — C6290 Malignant neoplasm of unspecified testis, unspecified whether descended or undescended: Secondary | ICD-10-CM | POA: Diagnosis not present

## 2018-03-21 DIAGNOSIS — C6290 Malignant neoplasm of unspecified testis, unspecified whether descended or undescended: Secondary | ICD-10-CM | POA: Diagnosis not present

## 2018-03-21 DIAGNOSIS — C6291 Malignant neoplasm of right testis, unspecified whether descended or undescended: Secondary | ICD-10-CM | POA: Diagnosis not present

## 2018-05-15 ENCOUNTER — Ambulatory Visit: Payer: PPO | Admitting: Emergency Medicine

## 2018-07-09 ENCOUNTER — Ambulatory Visit: Payer: PPO | Admitting: Emergency Medicine

## 2018-07-22 IMAGING — DX DG CHEST 2V
2 series · 2 of 2 positions shown · non-contrast
Comparison: Chest x-ray dated August 31, 2016.

CLINICAL DATA: History of testicular cancer status post right
orchiectomy in 0716.

EXAM:
CHEST  2 VIEW

[chest pa]
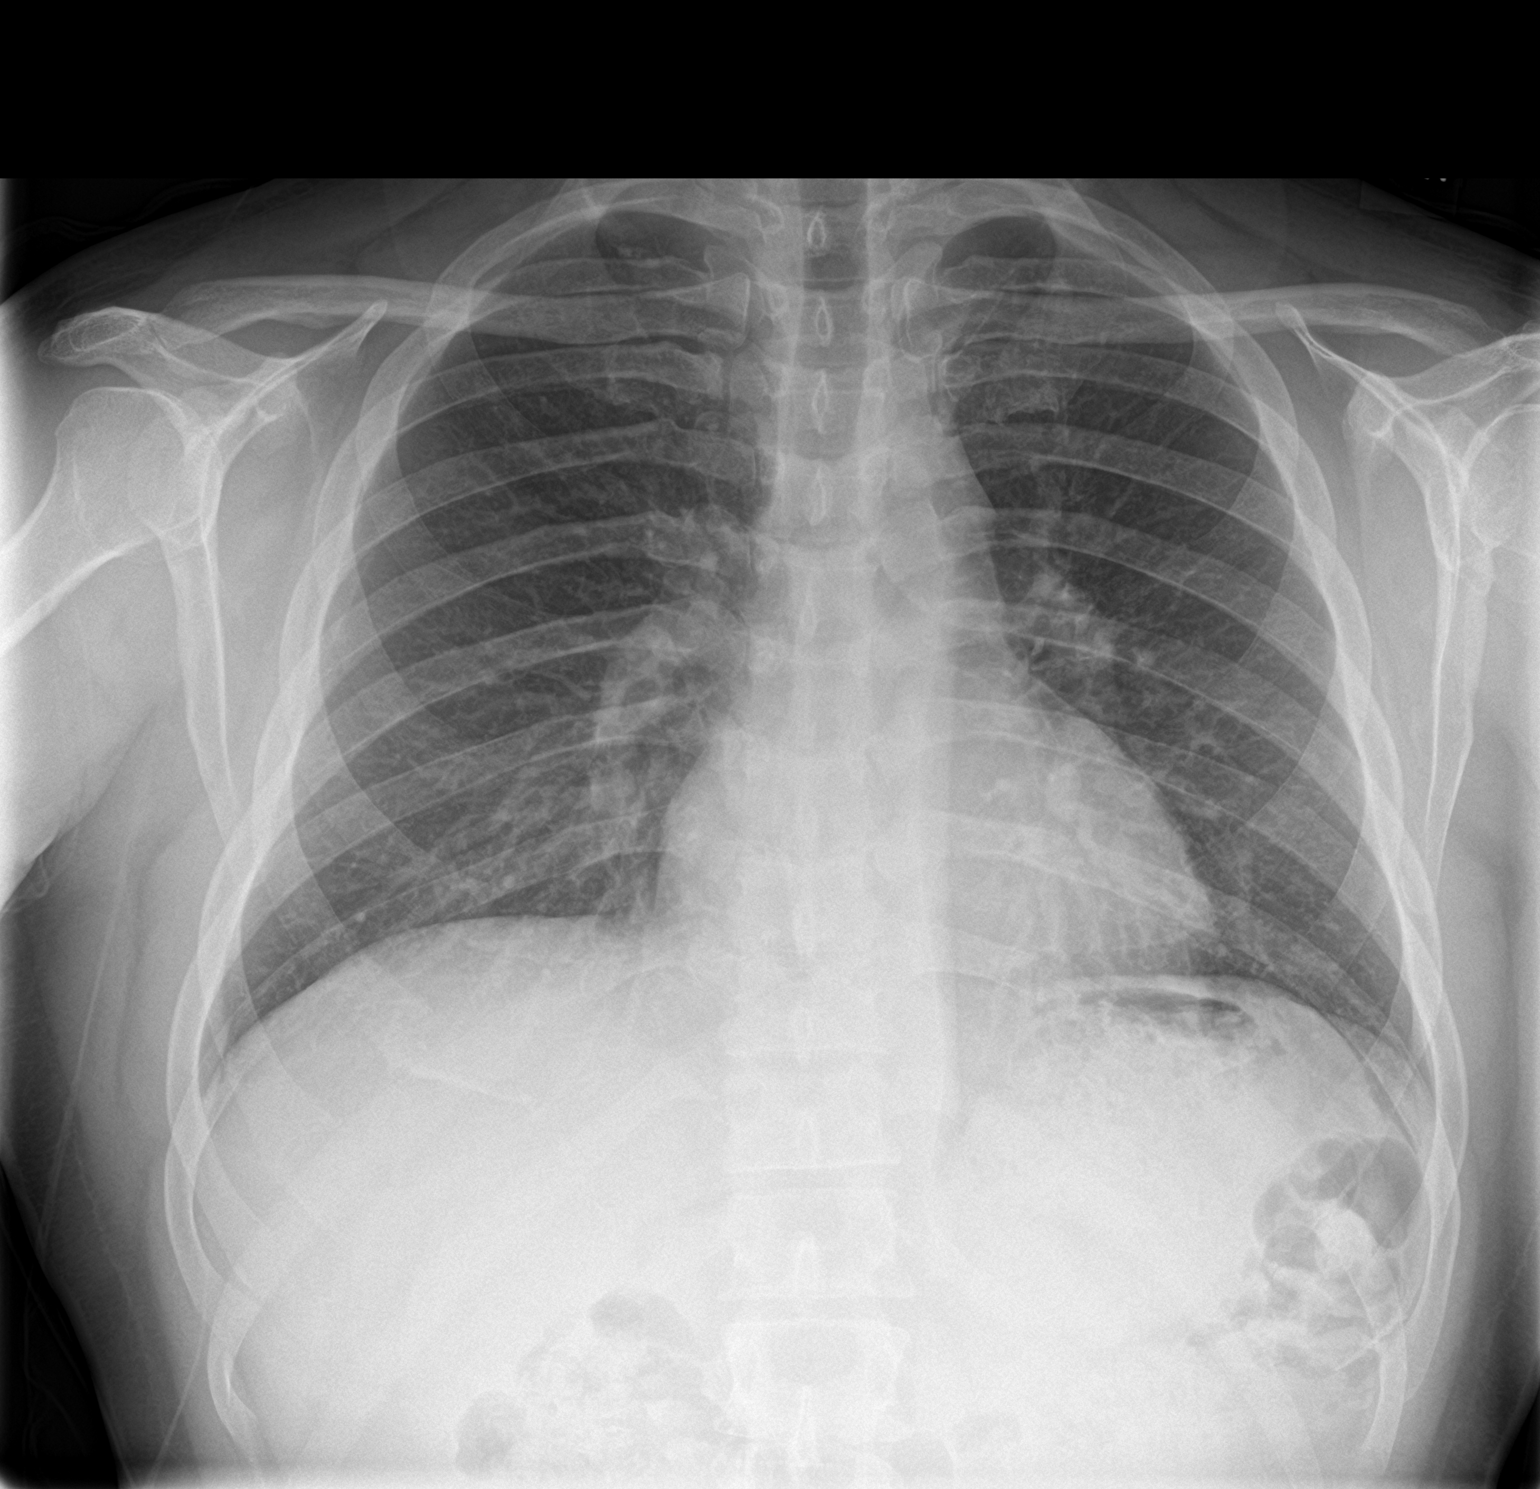

[chest lat]
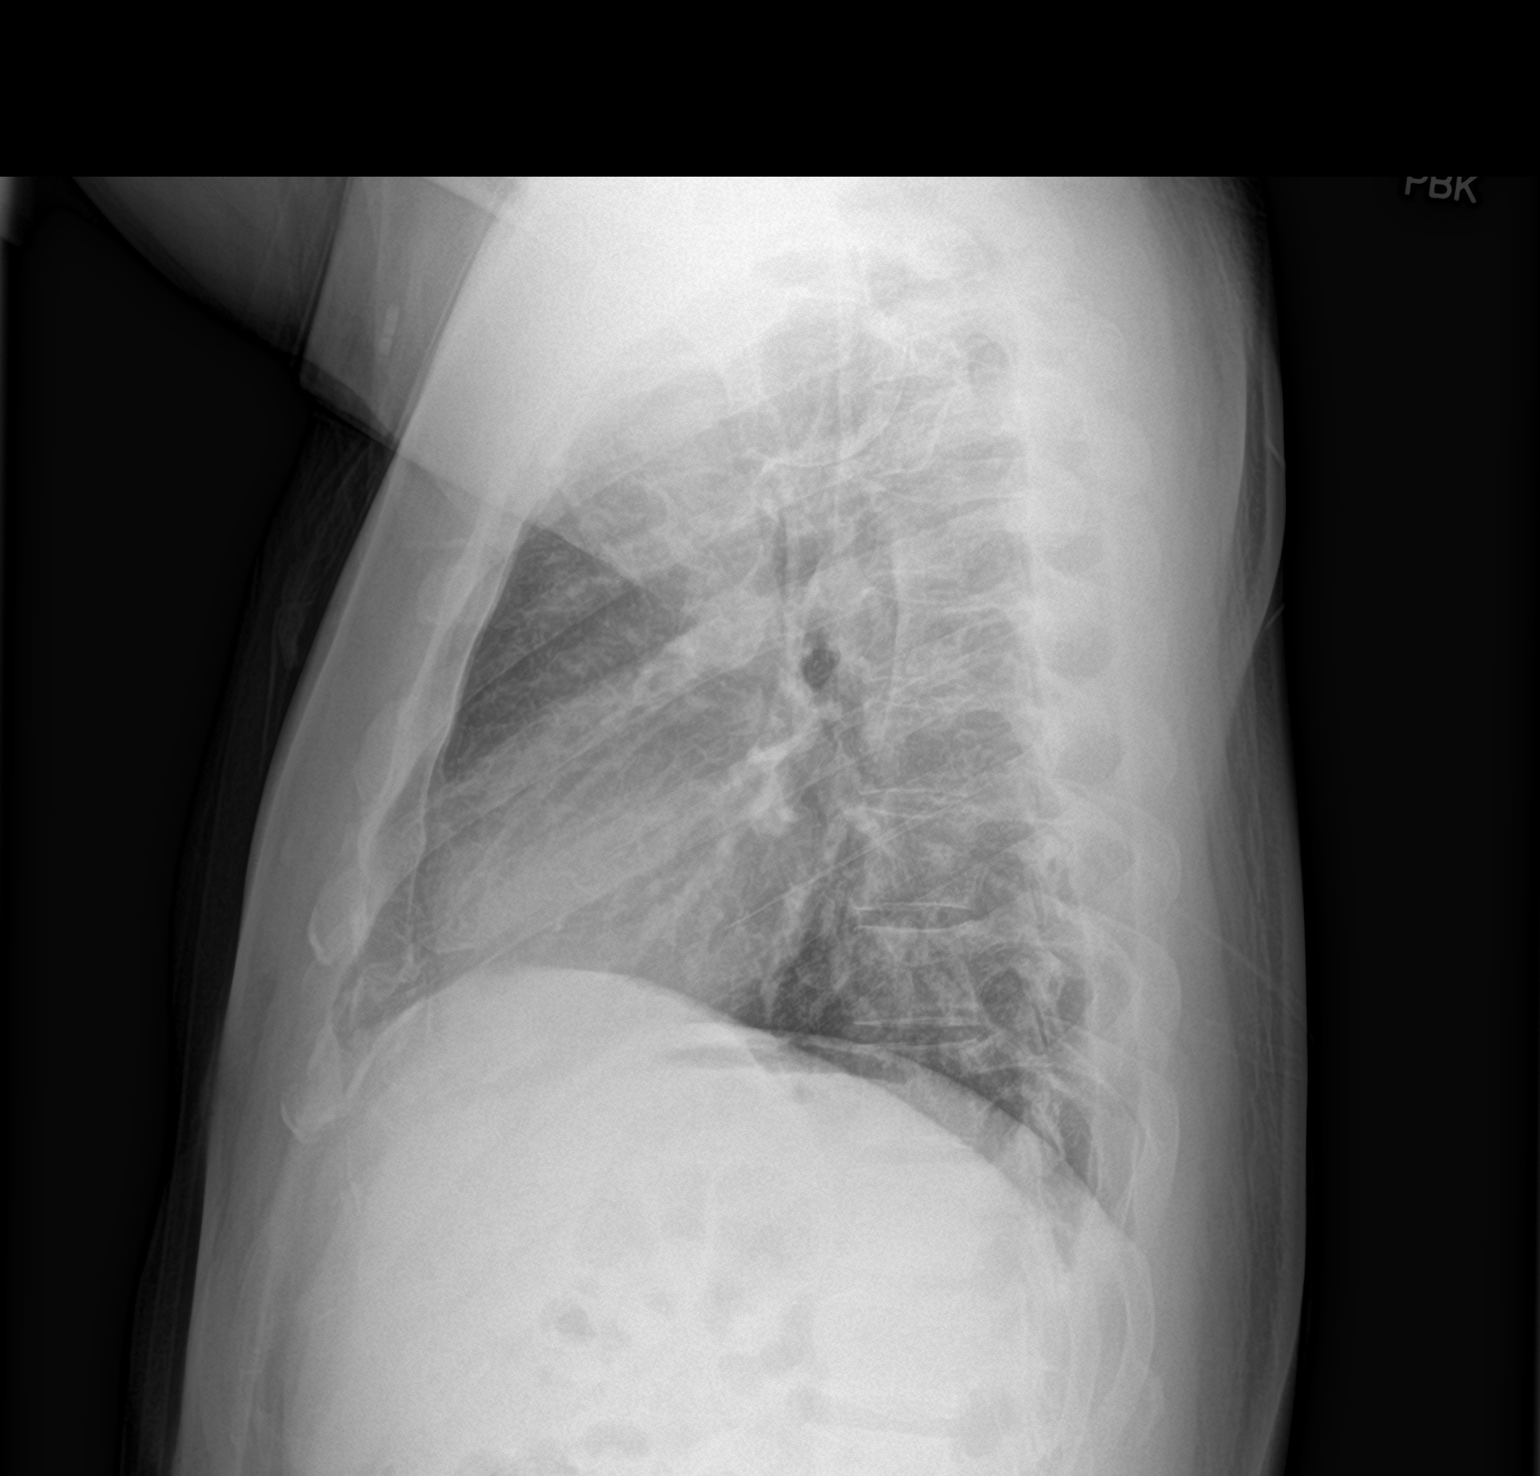

[2 of 2 positions shown; findings below may reference images not displayed]

FINDINGS: The heart size and mediastinal contours are within normal limits.
Both lungs are clear. The visualized skeletal structures are
unremarkable.
IMPRESSION: No active cardiopulmonary disease.

## 2018-07-30 ENCOUNTER — Encounter: Payer: Self-pay | Admitting: Emergency Medicine

## 2018-07-30 ENCOUNTER — Ambulatory Visit (INDEPENDENT_AMBULATORY_CARE_PROVIDER_SITE_OTHER): Payer: PPO | Admitting: Emergency Medicine

## 2018-07-30 ENCOUNTER — Other Ambulatory Visit: Payer: Self-pay

## 2018-07-30 VITALS — BP 106/62 | HR 86 | Temp 98.8°F | Resp 16 | Ht 60.0 in | Wt 153.0 lb

## 2018-07-30 DIAGNOSIS — C6291 Malignant neoplasm of right testis, unspecified whether descended or undescended: Secondary | ICD-10-CM | POA: Diagnosis not present

## 2018-07-30 DIAGNOSIS — Z7689 Persons encountering health services in other specified circumstances: Secondary | ICD-10-CM | POA: Diagnosis not present

## 2018-07-30 DIAGNOSIS — Q909 Down syndrome, unspecified: Secondary | ICD-10-CM

## 2018-07-30 NOTE — Patient Instructions (Addendum)

## 2018-07-30 NOTE — Progress Notes (Signed)
Zachary Wheeler 43 y.o.   Chief Complaint  Patient presents with  . Establish Care    former Dr Brigitte Pulse  . testicular cancer    right     HISTORY OF PRESENT ILLNESS: This is a 43 y.o. male here to establish care with me.  Former patient of Dr. Brigitte Pulse.  Has a history of testicular cancer.  Status post surgery in 2015 followed by radiation.  Last checkup early this year was very good.  Stable.  No complaints or medical concerns today. Patient has a history of Down syndrome, here today with his mother Zachary Wheeler who is a patient of mine.  HPI   Prior to Admission medications   Medication Sig Start Date End Date Taking? Authorizing Provider  fluticasone (FLONASE) 50 MCG/ACT nasal spray Place 1 spray into both nostrils daily.   Yes [provider]  Loratadine (CLARITIN) 10 MG CAPS Take by mouth as needed.   Yes [provider]  ibuprofen (ADVIL,MOTRIN) 200 MG tablet Take 200 mg by mouth every 6 (six) hours as needed.    [provider]  pramoxine-hydrocortisone (PROCTOCREAM-HC) 1-1 % rectal cream Place 1 application rectally 2 (two) times daily. Until sxs are resolved 10/31/16   Shawnee Knapp, MD    Allergies  Allergen Reactions  . Codeine Nausea Only  . Sulfa Antibiotics Rash    swelling  . Terbinafine Hcl Rash    Patient Active Problem List   Diagnosis Date Noted  . Allergic rhinitis 09/29/2015  . 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 Trisomy 21   . Hx of gross hematuria     Past Medical History:  Diagnosis Date  . Allergy   . Cancer ([place]) 12/02/13   seminoma  . Chicken pox   . Down syndrome    Trisomy 21  . Family history of anesthesia complication    mother has PONV  . Gall stones   . Gallstones   . Hematuria   . History of glomerulonephritis nephrologist-  dr Justin Mend   2002 resolved without kidney damage  . S/P radiation therapy 07/22/2014 through 08/14/2014    Right hockey  stick (periaortic/right iliac lymph nodes) 2550 cGy in 15 sessions, para aortic lymph node boost 60 cGy in 3 sessions   . Trisomy 21     Past Surgical History:  Procedure Laterality Date  . ORCHIECTOMY Right 12/02/2013   Procedure: INGUINAL ORCHIECTOMY;  Surgeon: Jorja Loa, MD;  Location: [facility];  Service: Urology;  Laterality: Right;  . ROOT CANAL      Social History   Socioeconomic History  . Marital status: Single    Spouse name: Not on file  . Number of children: Not on file  . Years of education: 84  . Highest education level: Not on file  Occupational History  . Occupation: Theatre stage manager  . Financial resource strain: Not on file  . Food insecurity:    Worry: Not on file    Inability: Not on file  . Transportation needs:    Medical: Not on file    Non-medical: Not on file  Tobacco Use  . Smoking status: Never Smoker  . Smokeless tobacco: Never Used  Substance and Sexual Activity  . Alcohol use: Yes    Comment: 1 beer on occas  . Drug use: No  . Sexual activity: Never  Lifestyle  . Physical activity:    Days per week: Not on file    Minutes  per session: Not on file  . Stress: Not on file  Relationships  . Social connections:    Talks on phone: Not on file    Gets together: Not on file    Attends religious service: Not on file    Active member of club or organization: Not on file    Attends meetings of clubs or organizations: Not on file    Relationship status: Not on file  . Intimate partner violence:    Fear of current or ex partner: Not on file    Emotionally abused: Not on file    Physically abused: Not on file    Forced sexual activity: Not on file  Other Topics Concern  . Not on file  Social History Narrative   Exercises regularly.    Family History  Problem Relation Age of Onset  . Hypertension Mother   . Hyperlipidemia Mother   . Hypertension Maternal Grandmother   . Emphysema  Maternal Grandmother   . Hypertension Maternal Grandfather   . Alcohol abuse Maternal Grandfather   . Cancer Paternal Grandmother   . Congestive Heart Failure Paternal Grandfather      Review of Systems  Constitutional: Negative.  Negative for chills and fever.  HENT: Negative for sinus pain and sore throat.   Respiratory: Negative.  Negative for cough and shortness of breath.   Cardiovascular: Negative.  Negative for chest pain and palpitations.  Gastrointestinal: Negative.  Negative for abdominal pain, diarrhea and vomiting.  Genitourinary: Negative for dysuria and hematuria.  Skin: Negative.  Negative for rash.  Neurological: Negative for dizziness.  Endo/Heme/Allergies: Negative.   All other systems reviewed and are negative.  Vitals:   07/30/18 1325  BP: 106/62  Pulse: 86  Resp: 16  Temp: 98.8 F (37.1 C)  SpO2: 97%      Physical Exam Vitals signs reviewed.  HENT:     Head: Normocephalic and atraumatic.  Eyes:     Extraocular Movements: Extraocular movements intact.     Pupils: Pupils are equal, round, and reactive to light.  Neck:     Musculoskeletal: Normal range of motion.  Cardiovascular:     Rate and Rhythm: Normal rate.  Pulmonary:     Effort: Pulmonary effort is normal.  Musculoskeletal: Normal range of motion.  Skin:    General: Skin is warm and dry.  Neurological:     General: No focal deficit present.     Mental Status: He is alert and oriented to person, place, and time.  Psychiatric:        Mood and Affect: Mood normal.        Behavior: Behavior normal.      ASSESSMENT & PLAN: [place] was seen today for establish care and testicular cancer.  Diagnoses and all orders for this visit:  Encounter to establish care  Trisomy 21  Seminoma of right testis [facility])    Patient Instructions       If you have lab work done today you will be contacted with your lab results within the next 2 weeks.  If you have not heard from [place] then please  contact us. The fastest way to get your results is to register for My Chart.   IF you received an x-ray today, you will receive an invoice from [facility]. Please contact [facility] at [phone] with questions or concerns regarding your invoice.   IF you received labwork today, you will receive an invoice from [facility]. Please contact [facility] at [phone] with  questions or concerns regarding your invoice.   Our billing staff will not be able to assist you with questions regarding bills from these companies.  You will be contacted with the lab results as soon as they are available. The fastest way to get your results is to activate your My Chart account. Instructions are located on the last page of this paperwork. If you have not heard from [place] regarding the results in 2 weeks, please contact this office.     Health Maintenance, Male A healthy lifestyle and preventive care is important for your health and wellness. Ask your health care provider about what schedule of regular examinations is right for you. What should I know about weight and diet? Eat a Healthy Diet  Eat plenty of vegetables, fruits, whole grains, low-fat dairy products, and lean protein.  Do not eat a lot of foods high in solid fats, added sugars, or salt.  Maintain a Healthy Weight Regular exercise can help you achieve or maintain a healthy weight. You should:  Do at least 150 minutes of exercise each week. The exercise should increase your heart rate and make you sweat (moderate-intensity exercise).  Do strength-training exercises at least twice a week. Watch Your Levels of Cholesterol and Blood Lipids  Have your blood tested for lipids and cholesterol every 5 years starting at 42 years of age. If you are at high risk for heart disease, you should start having your blood tested when you are 42 years old. You may need to have your cholesterol levels checked more often if: ? Your lipid or  cholesterol levels are high. ? You are older than 42 years of age. ? You are at high risk for heart disease. What should I know about cancer screening? Many types of cancers can be detected early and may often be prevented. Lung Cancer  You should be screened every year for lung cancer if: ? You are a current smoker who has smoked for at least 30 years. ? You are a former smoker who has quit within the past 15 years.  Talk to your health care provider about your screening options, when you should start screening, and how often you should be screened. Colorectal Cancer  Routine colorectal cancer screening usually begins at 42 years of age and should be repeated every 5-10 years until you are 42 years old. You may need to be screened more often if early forms of precancerous polyps or small growths are found. Your health care provider may recommend screening at an earlier age if you have risk factors for colon cancer.  Your health care provider may recommend using home test kits to check for hidden blood in the stool.  A small camera at the end of a tube can be used to examine your colon (sigmoidoscopy or colonoscopy). This checks for the earliest forms of colorectal cancer. Prostate and Testicular Cancer  Depending on your age and overall health, your health care provider may do certain tests to screen for prostate and testicular cancer.  Talk to your health care provider about any symptoms or concerns you have about testicular or prostate cancer. Skin Cancer  Check your skin from head to toe regularly.  Tell your health care provider about any new moles or changes in moles, especially if: ? There is a change in a mole's size, shape, or color. ? You have a mole that is larger than a pencil eraser.  Always use sunscreen. Apply sunscreen liberally and repeat throughout the  day.  Protect yourself by wearing long sleeves, pants, a wide-brimmed hat, and sunglasses when outside. What  should I know about heart disease, diabetes, and high blood pressure?  If you are 69-46 years of age, have your blood pressure checked every 3-5 years. If you are 34 years of age or older, have your blood pressure checked every year. You should have your blood pressure measured twice-once when you are at a hospital or clinic, and once when you are not at a hospital or clinic. Record the average of the two measurements. To check your blood pressure when you are not at a hospital or clinic, you can use: ? An automated blood pressure machine at a pharmacy. ? A home blood pressure monitor.  Talk to your health care provider about your target blood pressure.  If you are between 10-9 years old, ask your health care provider if you should take aspirin to prevent heart disease.  Have regular diabetes screenings by checking your fasting blood sugar level. ? If you are at a normal weight and have a low risk for diabetes, have this test once every three years after the age of 78. ? If you are overweight and have a high risk for diabetes, consider being tested at a younger age or more often.  A one-time screening for abdominal aortic aneurysm (AAA) by ultrasound is recommended for men aged 23-75 years who are current or former smokers. What should I know about preventing infection? Hepatitis B If you have a higher risk for hepatitis B, you should be screened for this virus. Talk with your health care provider to find out if you are at risk for hepatitis B infection. Hepatitis C Blood testing is recommended for:  Everyone born from [birth year] through 1964.  Anyone with known risk factors for hepatitis C. Sexually Transmitted Diseases (STDs)  You should be screened each year for STDs including gonorrhea and chlamydia if: ? You are sexually active and are younger than 42 years of age. ? You are older than 42 years of age and your health care provider tells you that you are at risk for this type of infection.  ? Your sexual activity has changed since you were last screened and you are at an increased risk for chlamydia or gonorrhea. Ask your health care provider if you are at risk.  Talk with your health care provider about whether you are at high risk of being infected with HIV. Your health care provider may recommend a prescription medicine to help prevent HIV infection. What else can I do?  Schedule regular health, dental, and eye exams.  Stay current with your vaccines (immunizations).  Do not use any tobacco products, such as cigarettes, chewing tobacco, and e-cigarettes. If you need help quitting, ask your health care provider.  Limit alcohol intake to no more than 2 drinks per day. One drink equals 12 ounces of beer, 5 ounces of wine, or 1 ounces of hard liquor.  Do not use street drugs.  Do not share needles.  Ask your health care provider for help if you need support or information about quitting drugs.  Tell your health care provider if you often feel depressed.  Tell your health care provider if you have ever been abused or do not feel safe at home. This information is not intended to replace advice given to you by your health care provider. Make sure you discuss any questions you have with your health care provider. Document Released: 08/06/2007 Document Revised:  10/07/2015 Document Reviewed: 11/11/2014 Elsevier Interactive Patient Education  2019 [employer]      Agustina Caroli, MD [facility]

## 2018-08-14 ENCOUNTER — Ambulatory Visit: Payer: PPO | Admitting: Emergency Medicine

## 2018-11-13 ENCOUNTER — Ambulatory Visit (INDEPENDENT_AMBULATORY_CARE_PROVIDER_SITE_OTHER): Payer: PPO | Admitting: Registered Nurse

## 2018-11-13 ENCOUNTER — Other Ambulatory Visit: Payer: Self-pay

## 2018-11-13 DIAGNOSIS — Z23 Encounter for immunization: Secondary | ICD-10-CM | POA: Diagnosis not present

## 2018-11-13 NOTE — Patient Instructions (Addendum)
Injection given in the left deltoid, pt tolerated well.

## 2018-11-20 DIAGNOSIS — H0102B Squamous blepharitis left eye, upper and lower eyelids: Secondary | ICD-10-CM | POA: Diagnosis not present

## 2018-11-20 DIAGNOSIS — H0102A Squamous blepharitis right eye, upper and lower eyelids: Secondary | ICD-10-CM | POA: Diagnosis not present

## 2018-11-20 DIAGNOSIS — H10413 Chronic giant papillary conjunctivitis, bilateral: Secondary | ICD-10-CM | POA: Diagnosis not present

## 2019-01-07 DIAGNOSIS — N009 Acute nephritic syndrome with unspecified morphologic changes: Secondary | ICD-10-CM | POA: Diagnosis not present

## 2019-09-20 ENCOUNTER — Other Ambulatory Visit (HOSPITAL_COMMUNITY): Payer: Self-pay | Admitting: Urology

## 2019-09-20 ENCOUNTER — Other Ambulatory Visit: Payer: Self-pay

## 2019-09-20 ENCOUNTER — Ambulatory Visit (HOSPITAL_COMMUNITY)
Admission: RE | Admit: 2019-09-20 | Discharge: 2019-09-20 | Disposition: A | Discharge status: Home / Self Care | Payer: PPO | Source: Ambulatory Visit | Attending: Urology | Admitting: Urology

## 2019-09-20 DIAGNOSIS — C6210 Malignant neoplasm of unspecified descended testis: Secondary | ICD-10-CM

## 2019-09-20 DIAGNOSIS — C6290 Malignant neoplasm of unspecified testis, unspecified whether descended or undescended: Secondary | ICD-10-CM | POA: Diagnosis not present

## 2019-09-23 DIAGNOSIS — Z8547 Personal history of malignant neoplasm of testis: Secondary | ICD-10-CM | POA: Diagnosis not present

## 2020-04-14 ENCOUNTER — Ambulatory Visit (INDEPENDENT_AMBULATORY_CARE_PROVIDER_SITE_OTHER): Payer: Medicare Other | Admitting: Emergency Medicine

## 2020-04-14 ENCOUNTER — Other Ambulatory Visit: Payer: Self-pay

## 2020-04-14 ENCOUNTER — Encounter: Payer: Self-pay | Admitting: Emergency Medicine

## 2020-04-14 VITALS — BP 117/69 | HR 71 | Temp 98.2°F | Resp 16 | Ht 60.0 in | Wt 156.0 lb

## 2020-04-14 DIAGNOSIS — Z0000 Encounter for general adult medical examination without abnormal findings: Secondary | ICD-10-CM

## 2020-04-14 DIAGNOSIS — B351 Tinea unguium: Secondary | ICD-10-CM

## 2020-04-14 DIAGNOSIS — Z1329 Encounter for screening for other suspected endocrine disorder: Secondary | ICD-10-CM

## 2020-04-14 DIAGNOSIS — Z130 Encounter for screening for diseases of the blood and blood-forming organs and certain disorders involving the immune mechanism: Secondary | ICD-10-CM

## 2020-04-14 DIAGNOSIS — Z13220 Encounter for screening for lipoid disorders: Secondary | ICD-10-CM

## 2020-04-14 DIAGNOSIS — M21612 Bunion of left foot: Secondary | ICD-10-CM | POA: Diagnosis not present

## 2020-04-14 DIAGNOSIS — M21611 Bunion of right foot: Secondary | ICD-10-CM

## 2020-04-14 DIAGNOSIS — Q909 Down syndrome, unspecified: Secondary | ICD-10-CM

## 2020-04-14 DIAGNOSIS — Z0001 Encounter for general adult medical examination with abnormal findings: Secondary | ICD-10-CM | POA: Diagnosis not present

## 2020-04-14 DIAGNOSIS — C6291 Malignant neoplasm of right testis, unspecified whether descended or undescended: Secondary | ICD-10-CM

## 2020-04-14 DIAGNOSIS — Z13228 Encounter for screening for other metabolic disorders: Secondary | ICD-10-CM

## 2020-04-14 LAB — CMP14+EGFR
ALT: 22 IU/L (ref 0–44)
AST: 23 IU/L (ref 0–40)
Albumin/Globulin Ratio: 1.4 (ref 1.2–2.2)
Albumin: 4.3 g/dL (ref 4.0–5.0)
Alkaline Phosphatase: 87 IU/L (ref 44–121)
BUN/Creatinine Ratio: 15 (ref 9–20)
BUN: 15 mg/dL (ref 6–24)
Bilirubin Total: 0.5 mg/dL (ref 0.0–1.2)
CO2: 24 mmol/L (ref 20–29)
Calcium: 9.3 mg/dL (ref 8.7–10.2)
Chloride: 104 mmol/L (ref 96–106)
Creatinine, Ser: 0.98 mg/dL (ref 0.76–1.27)
GFR calc Af Amer: 109 mL/min/{1.73_m2} (ref >59)
GFR calc non Af Amer: 94 mL/min/{1.73_m2} (ref >59)
Globulin, Total: 3 g/dL (ref 1.5–4.5)
Glucose: 91 mg/dL (ref 65–99)
Potassium: 4.9 mmol/L (ref 3.5–5.2)
Sodium: 141 mmol/L (ref 134–144)
Total Protein: 7.3 g/dL (ref 6.0–8.5)

## 2020-04-14 LAB — LIPID PANEL
Chol/HDL Ratio: 4.7 ratio (ref 0.0–5.0)
Cholesterol, Total: 212 mg/dL — ABNORMAL HIGH (ref 100–199)
HDL: 45 mg/dL (ref >39)
LDL Chol Calc (NIH): 140 mg/dL — ABNORMAL HIGH (ref 0–99)
Triglycerides: 151 mg/dL — ABNORMAL HIGH (ref 0–149)
VLDL Cholesterol Cal: 27 mg/dL (ref 5–40)

## 2020-04-14 LAB — HEMOGLOBIN A1C
Est. average glucose Bld gHb Est-mCnc: 105 mg/dL
Hgb A1c MFr Bld: 5.3 % (ref 4.8–5.6)

## 2020-04-14 NOTE — Progress Notes (Signed)
Zachary Wheeler 45 y.o.   Chief Complaint  Patient presents with  . Annual Exam    HISTORY OF PRESENT ILLNESS: This is a 45 y.o. male here for his annual exam. Has history of Down syndrome. Also has history of testicular cancer.  Doing well. Healthy lifestyle. Fully vaccinated against COVID with a booster. Has no complaints or medical concerns today.  HPI   Prior to Admission medications   Medication Sig Start Date End Date Taking? Authorizing Provider  fluticasone (FLONASE) 50 MCG/ACT nasal spray Place 1 spray into both nostrils daily.   Yes [provider]  ibuprofen (ADVIL,MOTRIN) 200 MG tablet Take 200 mg by mouth every 6 (six) hours as needed.   Yes [provider]  pramoxine-hydrocortisone (PROCTOCREAM-HC) 1-1 % rectal cream Place 1 application rectally 2 (two) times daily. Until sxs are resolved 10/31/16  Yes Shawnee Knapp, MD    Allergies  Allergen Reactions  . Codeine Nausea Only  . Sulfa Antibiotics Rash    swelling  . Terbinafine Hcl Rash    Patient Active Problem List   Diagnosis Date Noted  . Allergic rhinitis 09/29/2015  . 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 Trisomy 21   . Hx of gross hematuria     Past Medical History:  Diagnosis Date  . Allergy   . Cancer ([place]) 12/02/13   seminoma  . Chicken pox   . Down syndrome    Trisomy 21  . Family history of anesthesia complication    mother has PONV  . Gall stones   . Gallstones   . Hematuria   . History of glomerulonephritis nephrologist-  dr Justin Mend   2002 resolved without kidney damage  . S/P radiation therapy 07/22/2014 through 08/14/2014    Right hockey stick (periaortic/right iliac lymph nodes) 2550 cGy in 15 sessions, para aortic lymph node boost 60 cGy in 3 sessions   . Trisomy 21     Past Surgical History:  Procedure Laterality Date  . ORCHIECTOMY Right 12/02/2013   Procedure: INGUINAL  ORCHIECTOMY;  Surgeon: Jorja Loa, MD;  Location: [facility];  Service: Urology;  Laterality: Right;  . ROOT CANAL      Social History   Socioeconomic History  . Marital status: Single    Spouse name: Not on file  . Number of children: Not on file  . Years of education: 50  . Highest education level: Not on file  Occupational History  . Occupation: janitor  Tobacco Use  . Smoking status: Never Smoker  . Smokeless tobacco: Never Used  Substance and Sexual Activity  . Alcohol use: Yes    Comment: 1 beer on occas  . Drug use: No  . Sexual activity: Never  Other Topics Concern  . Not on file  Social History Narrative   Exercises regularly.   Social Determinants of Health   Financial Resource Strain: Not on file  Food Insecurity: Not on file  Transportation Needs: Not on file  Physical Activity: Not on file  Stress: Not on file  Social Connections: Not on file  Intimate Partner Violence: Not on file    Family History  Problem Relation Age of Onset  . Hypertension Mother   . Hyperlipidemia Mother   . Hypertension Maternal Grandmother   . Emphysema Maternal Grandmother   . Hypertension Maternal Grandfather   . Alcohol abuse Maternal Grandfather   . Cancer Paternal Grandmother   . Congestive Heart Failure Paternal Grandfather  Review of Systems  Constitutional: Negative.  Negative for chills and fever.  HENT: Negative.  Negative for congestion and sore throat.   Respiratory: Negative.  Negative for cough and shortness of breath.   Cardiovascular: Negative.  Negative for chest pain and palpitations.  Gastrointestinal: Negative.  Negative for abdominal pain, diarrhea, nausea and vomiting.  Genitourinary: Negative.  Negative for dysuria and hematuria.  Musculoskeletal: Negative.  Negative for myalgias and neck pain.  Skin: Negative.  Negative for rash.  Neurological: Negative.  Negative for dizziness and headaches.  All other systems  reviewed and are negative.   Today's Vitals   04/14/20 0856  BP: 117/69  Pulse: 71  Resp: 16  Temp: 98.2 F (36.8 C)  TempSrc: Temporal  SpO2: 99%  Weight: 156 lb (70.8 kg)  Height: 5' (1.524 m)   Body mass index is 30.47 kg/m. Wt Readings from Last 3 Encounters:  04/14/20 156 lb (70.8 kg)  07/30/18 153 lb (69.4 kg)  10/31/16 154 lb 6.4 oz (70 kg)    Physical Exam Vitals reviewed.  Constitutional:      Appearance: Normal appearance.  HENT:     Head: Normocephalic.  Eyes:     Extraocular Movements: Extraocular movements intact.     Pupils: Pupils are equal, round, and reactive to light.  Neck:     Vascular: No carotid bruit.  Cardiovascular:     Rate and Rhythm: Normal rate and regular rhythm.     Pulses: Normal pulses.     Heart sounds: Normal heart sounds.  Pulmonary:     Effort: Pulmonary effort is normal.     Breath sounds: Normal breath sounds.  Abdominal:     General: Bowel sounds are normal. There is no distension.     Palpations: Abdomen is soft.     Tenderness: There is no abdominal tenderness.  Musculoskeletal:        General: Normal range of motion.     Cervical back: Normal range of motion and neck supple. No tenderness.     Comments: Feet: Onychomycosis of every toenail Bilateral bunions  Lymphadenopathy:     Cervical: No cervical adenopathy.  Skin:    General: Skin is warm and dry.  Neurological:     General: No focal deficit present.     Mental Status: He is alert and oriented to person, place, and time.  Psychiatric:        Mood and Affect: Mood normal.        Behavior: Behavior normal.      ASSESSMENT & PLAN: Zachary Wheeler was seen today for annual exam.  Diagnoses and all orders for this visit:  Routine general medical examination at a health care facility  Screening for deficiency anemia  Screening for lipoid disorders -     Lipid panel  Screening for endocrine, metabolic and immunity disorder -     CMP14+EGFR -     Hemoglobin  A1c  Onychomycosis -     Ambulatory referral to Podiatry  Bilateral bunions -     Ambulatory referral to Podiatry  Trisomy 21  Seminoma of right testis [facility])    Patient Instructions       If you have lab work done today you will be contacted with your lab results within the next 2 weeks.  If you have not heard from [place] then please contact us. The fastest way to get your results is to register for My Chart.   IF you received an x-ray today, you will  receive an Pharmacologist from [facility]. Please contact [facility] at [phone] with questions or concerns regarding your invoice.   IF you received labwork today, you will receive an invoice from [place] [facility]. Please contact [facility] at [phone] with questions or concerns regarding your invoice.   Our billing staff will not be able to assist you with questions regarding bills from these companies.  You will be contacted with the lab results as soon as they are available. The fastest way to get your results is to activate your My Chart account. Instructions are located on the last page of this paperwork. If you have not heard from [place] regarding the results in 2 weeks, please contact this office.      Health Maintenance, Male Adopting a healthy lifestyle and getting preventive care are important in promoting health and wellness. Ask your health care provider about:  The right schedule for you to have regular tests and exams.  Things you can do on your own to prevent diseases and keep yourself healthy. What should I know about diet, weight, and exercise? Eat a healthy diet  Eat a diet that includes plenty of vegetables, fruits, low-fat dairy products, and lean protein.  Do not eat a lot of foods that are high in solid fats, added sugars, or sodium.   Maintain a healthy weight Body mass index (BMI) is a measurement that can be used to identify possible weight problems. It estimates body fat based on height and  weight. Your health care provider can help determine your BMI and help you achieve or maintain a healthy weight. Get regular exercise Get regular exercise. This is one of the most important things you can do for your health. Most adults should:  Exercise for at least 150 minutes each week. The exercise should increase your heart rate and make you sweat (moderate-intensity exercise).  Do strengthening exercises at least twice a week. This is in addition to the moderate-intensity exercise.  Spend less time sitting. Even light physical activity can be beneficial. Watch cholesterol and blood lipids Have your blood tested for lipids and cholesterol at 44 years of age, then have this test every 5 years. You may need to have your cholesterol levels checked more often if:  Your lipid or cholesterol levels are high.  You are older than 44 years of age.  You are at high risk for heart disease. What should I know about cancer screening? Many types of cancers can be detected early and may often be prevented. Depending on your health history and family history, you may need to have cancer screening at various ages. This may include screening for:  Colorectal cancer.  Prostate cancer.  Skin cancer.  Lung cancer. What should I know about heart disease, diabetes, and high blood pressure? Blood pressure and heart disease  High blood pressure causes heart disease and increases the risk of stroke. This is more likely to develop in people who have high blood pressure readings, are of African descent, or are overweight.  Talk with your health care provider about your target blood pressure readings.  Have your blood pressure checked: ? Every 3-5 years if you are 31-64 years of age. ? Every year if you are 50 years old or older.  If you are between the ages of 85 and 16 and are a current or former smoker, ask your health care provider if you should have a one-time screening for abdominal aortic  aneurysm (AAA). Diabetes Have regular diabetes screenings. This checks  your fasting blood sugar level. Have the screening done:  Once every three years after age 51 if you are at a normal weight and have a low risk for diabetes.  More often and at a younger age if you are overweight or have a high risk for diabetes. What should I know about preventing infection? Hepatitis B If you have a higher risk for hepatitis B, you should be screened for this virus. Talk with your health care provider to find out if you are at risk for hepatitis B infection. Hepatitis C Blood testing is recommended for:  Everyone born from [birth year] through 1965.  Anyone with known risk factors for hepatitis C. Sexually transmitted infections (STIs)  You should be screened each year for STIs, including gonorrhea and chlamydia, if: ? You are sexually active and are younger than 44 years of age. ? You are older than 44 years of age and your health care provider tells you that you are at risk for this type of infection. ? Your sexual activity has changed since you were last screened, and you are at increased risk for chlamydia or gonorrhea. Ask your health care provider if you are at risk.  Ask your health care provider about whether you are at high risk for HIV. Your health care provider may recommend a prescription medicine to help prevent HIV infection. If you choose to take medicine to prevent HIV, you should first get tested for HIV. You should then be tested every 3 months for as long as you are taking the medicine. Follow these instructions at home: Lifestyle  Do not use any products that contain nicotine or tobacco, such as cigarettes, e-cigarettes, and chewing tobacco. If you need help quitting, ask your health care provider.  Do not use street drugs.  Do not share needles.  Ask your health care provider for help if you need support or information about quitting drugs. Alcohol use  Do not drink alcohol if  your health care provider tells you not to drink.  If you drink alcohol: ? Limit how much you have to 0-2 drinks a day. ? Be aware of how much alcohol is in your drink. In the [place] one drink equals one 12 oz bottle of beer (355 mL), one 5 oz glass of wine (148 mL), or one 1 oz glass of hard liquor (44 mL). General instructions  Schedule regular health, dental, and eye exams.  Stay current with your vaccines.  Tell your health care provider if: ? You often feel depressed. ? You have ever been abused or do not feel safe at home. Summary  Adopting a healthy lifestyle and getting preventive care are important in promoting health and wellness.  Follow your health care provider's instructions about healthy diet, exercising, and getting tested or screened for diseases.  Follow your health care provider's instructions on monitoring your cholesterol and blood pressure. This information is not intended to replace advice given to you by your health care provider. Make sure you discuss any questions you have with your health care provider. Document Revised: 01/31/2018 Document Reviewed: 01/31/2018 Elsevier Patient Education  2021 Elsevier Inc.      Agustina Caroli, MD [facility]

## 2020-04-14 NOTE — Patient Instructions (Addendum)
   If you have lab work done today you will be contacted with your lab results within the next 2 weeks.  If you have not heard from us then please contact us. The fastest way to get your results is to register for My Chart.   IF you received an x-ray today, you will receive an invoice from [facility]. Please contact  Radiology at [phone] with questions or concerns regarding your invoice.   IF you received labwork today, you will receive an invoice from [facility]. Please contact [facility] at [phone] with questions or concerns regarding your invoice.   Our billing staff will not be able to assist you with questions regarding bills from these companies.  You will be contacted with the lab results as soon as they are available. The fastest way to get your results is to activate your My Chart account. Instructions are located on the last page of this paperwork. If you have not heard from us regarding the results in 2 weeks, please contact this office.      Health Maintenance, Male Adopting a healthy lifestyle and getting preventive care are important in promoting health and wellness. Ask your health care provider about:  The right schedule for you to have regular tests and exams.  Things you can do on your own to prevent diseases and keep yourself healthy. What should I know about diet, weight, and exercise? Eat a healthy diet  Eat a diet that includes plenty of vegetables, fruits, low-fat dairy products, and lean protein.  Do not eat a lot of foods that are high in solid fats, added sugars, or sodium.   Maintain a healthy weight Body mass index (BMI) is a measurement that can be used to identify possible weight problems. It estimates body fat based on height and weight. Your health care provider can help determine your BMI and help you achieve or maintain a healthy weight. Get regular exercise Get regular exercise. This is one of the most important things you  can do for your health. Most adults should:  Exercise for at least 150 minutes each week. The exercise should increase your heart rate and make you sweat (moderate-intensity exercise).  Do strengthening exercises at least twice a week. This is in addition to the moderate-intensity exercise.  Spend less time sitting. Even light physical activity can be beneficial. Watch cholesterol and blood lipids Have your blood tested for lipids and cholesterol at 44 years of age, then have this test every 5 years. You may need to have your cholesterol levels checked more often if:  Your lipid or cholesterol levels are high.  You are older than 44 years of age.  You are at high risk for heart disease. What should I know about cancer screening? Many types of cancers can be detected early and may often be prevented. Depending on your health history and family history, you may need to have cancer screening at various ages. This may include screening for:  Colorectal cancer.  Prostate cancer.  Skin cancer.  Lung cancer. What should I know about heart disease, diabetes, and high blood pressure? Blood pressure and heart disease  High blood pressure causes heart disease and increases the risk of stroke. This is more likely to develop in people who have high blood pressure readings, are of African descent, or are overweight.  Talk with your health care provider about your target blood pressure readings.  Have your blood pressure checked: ? Every 3-5 years if you are   18-39 years of age. ? Every year if you are 40 years old or older.  If you are between the ages of 65 and 75 and are a current or former smoker, ask your health care provider if you should have a one-time screening for abdominal aortic aneurysm (AAA). Diabetes Have regular diabetes screenings. This checks your fasting blood sugar level. Have the screening done:  Once every three years after age 45 if you are at a normal weight and have  a low risk for diabetes.  More often and at a younger age if you are overweight or have a high risk for diabetes. What should I know about preventing infection? Hepatitis B If you have a higher risk for hepatitis B, you should be screened for this virus. Talk with your health care provider to find out if you are at risk for hepatitis B infection. Hepatitis C Blood testing is recommended for:  Everyone born from 1945 through 1965.  Anyone with known risk factors for hepatitis C. Sexually transmitted infections (STIs)  You should be screened each year for STIs, including gonorrhea and chlamydia, if: ? You are sexually active and are younger than 44 years of age. ? You are older than 44 years of age and your health care provider tells you that you are at risk for this type of infection. ? Your sexual activity has changed since you were last screened, and you are at increased risk for chlamydia or gonorrhea. Ask your health care provider if you are at risk.  Ask your health care provider about whether you are at high risk for HIV. Your health care provider may recommend a prescription medicine to help prevent HIV infection. If you choose to take medicine to prevent HIV, you should first get tested for HIV. You should then be tested every 3 months for as long as you are taking the medicine. Follow these instructions at home: Lifestyle  Do not use any products that contain nicotine or tobacco, such as cigarettes, e-cigarettes, and chewing tobacco. If you need help quitting, ask your health care provider.  Do not use street drugs.  Do not share needles.  Ask your health care provider for help if you need support or information about quitting drugs. Alcohol use  Do not drink alcohol if your health care provider tells you not to drink.  If you drink alcohol: ? Limit how much you have to 0-2 drinks a day. ? Be aware of how much alcohol is in your drink. In the [place] one drink equals one 12  oz bottle of beer (355 mL), one 5 oz glass of wine (148 mL), or one 1 oz glass of hard liquor (44 mL). General instructions  Schedule regular health, dental, and eye exams.  Stay current with your vaccines.  Tell your health care provider if: ? You often feel depressed. ? You have ever been abused or do not feel safe at home. Summary  Adopting a healthy lifestyle and getting preventive care are important in promoting health and wellness.  Follow your health care provider's instructions about healthy diet, exercising, and getting tested or screened for diseases.  Follow your health care provider's instructions on monitoring your cholesterol and blood pressure. This information is not intended to replace advice given to you by your health care provider. Make sure you discuss any questions you have with your health care provider. Document Revised: 01/31/2018 Document Reviewed: 01/31/2018 Elsevier Patient Education  2021 Elsevier Inc.  

## 2020-04-22 ENCOUNTER — Ambulatory Visit (INDEPENDENT_AMBULATORY_CARE_PROVIDER_SITE_OTHER): Payer: Medicare Other

## 2020-04-22 ENCOUNTER — Other Ambulatory Visit: Payer: Self-pay

## 2020-04-22 ENCOUNTER — Ambulatory Visit: Payer: Medicare Other | Admitting: Podiatry

## 2020-04-22 DIAGNOSIS — B351 Tinea unguium: Secondary | ICD-10-CM | POA: Diagnosis not present

## 2020-04-22 DIAGNOSIS — M79674 Pain in right toe(s): Secondary | ICD-10-CM

## 2020-04-22 DIAGNOSIS — M2012 Hallux valgus (acquired), left foot: Secondary | ICD-10-CM

## 2020-04-22 DIAGNOSIS — M21619 Bunion of unspecified foot: Secondary | ICD-10-CM

## 2020-04-22 DIAGNOSIS — M2011 Hallux valgus (acquired), right foot: Secondary | ICD-10-CM

## 2020-04-22 DIAGNOSIS — M79675 Pain in left toe(s): Secondary | ICD-10-CM | POA: Diagnosis not present

## 2020-04-22 MED ORDER — EFINACONAZOLE 10 % EX SOLN: 1.0000 "application " | Freq: Every day | CUTANEOUS | 1 refills | Status: DC

## 2020-04-23 ENCOUNTER — Telehealth: Payer: Self-pay | Admitting: Podiatry

## 2020-04-23 NOTE — Telephone Encounter (Signed)
Patient Father called looking for medication that was supposed to be called in. Looks like the prescription was printed, instead of called in.

## 2020-04-24 ENCOUNTER — Other Ambulatory Visit: Payer: Self-pay

## 2020-04-24 MED ORDER — EFINACONAZOLE 10 % EX SOLN: 1.0000 "application " | Freq: Every day | CUTANEOUS | 1 refills | Status: DC

## 2020-04-26 NOTE — Progress Notes (Signed)
Subjective: 45 y.o. male presenting today as a new patient for evaluation of 2 separate complaints.  First the patient states that he has had toenail fungus for several years.  They are thick and yellow and discolored.  He has tried oral Lamisil in the past but it developed kidney problems/infections according to the patient's given history.  They are looking for different treatment options other than oral medication.  Also the patient has bunion deformities to the bilateral feet for several years.  It was a gradual onset.  Currently they are not symptomatic or painful.  He is able to ambulate just fine and he has no pain associated to the hallux valgus deformities.  Past Medical History:  Diagnosis Date  . Allergy   . Cancer ([place]) 12/02/13   seminoma  . Chicken pox   . Down syndrome    Trisomy 21  . Family history of anesthesia complication    mother has PONV  . Gall stones   . Gallstones   . Hematuria   . History of glomerulonephritis nephrologist-  dr Justin Mend   2002 resolved without kidney damage  . S/P radiation therapy 07/22/2014 through 08/14/2014    Right hockey stick (periaortic/right iliac lymph nodes) 2550 cGy in 15 sessions, para aortic lymph node boost 60 cGy in 3 sessions   . Trisomy 21     Objective: Physical Exam General: The patient is alert and oriented x3 in no acute distress.  Dermatology: Hyperkeratotic, discolored, thickened, onychodystrophy noted to the bilateral feet 1-5. Skin is warm, dry and supple bilateral lower extremities. Negative for open lesions or macerations.  Vascular: Palpable pedal pulses bilaterally. No edema or erythema noted. Capillary refill within normal limits.  Neurological: Epicritic and protective threshold grossly intact bilaterally.   Musculoskeletal Exam: Clinical evidence of bunion deformity noted to the respective foot. There is moderate pain on palpation range  of motion of the first MPJ. Lateral deviation of the hallux noted consistent with hallux abductovalgus.  Radiographic Exam: Increased intermetatarsal angle greater than 15 with a hallux abductus angle greater than 30 noted on AP view. Moderate degenerative changes noted within the first MPJ.  There is actually some disarticulation/subluxation of the first MTPJ of the joints bilaterally  Assessment: #1 Onychomycosis of toenails 1-5 bilateral #2  Hallux valgus deformity bilateral  Plan of Care:  #1 Patient was evaluated.  X-rays reviewed today #2  As relates to the bunion deformities, since the bunions are completely asymptomatic the patient is not looking for any surgical intervention at this time.  Recommend good supportive shoes that do not rub or irritate the bunion deformities #3 in regards to the onychomycosis of the toenails, we discussed different treatment options including oral, topical, and laser therapy treatment modalities.  Patient would like to pursue topical and laser treatment. #4 prescription for Jublia Efinaconazole 10% solution provided for the patient.  If this is too costly we can prescribe topical ciclopirox 8% solution as an alternative #5 appointment with nurse for laser antifungal therapy sessions #6 return to clinic as needed   Edrick Kins, DPM [facility]  Dr. Edrick Kins, DPM    [address], [place] [address]  Office [phone]  Fax [phone]

## 2020-04-27 ENCOUNTER — Telehealth: Payer: Self-pay | Admitting: Podiatry

## 2020-04-27 NOTE — Telephone Encounter (Signed)
Patient Father called looking for medication that was supposed to be called in. Looks like the prescription was printed, instead of called in .It needs to be sent to [facility] on [address] and [address]

## 2020-04-28 ENCOUNTER — Telehealth: Payer: Self-pay | Admitting: Podiatry

## 2020-04-28 NOTE — Telephone Encounter (Signed)
Patients father calling to inform Dr. Amalia Hailey that prescription for Efinaconazole 10 %  was not ordered to [place], [address]. Requesting that prescription be filled again and sent to preferred [place], [place] AT [address]

## 2020-04-30 ENCOUNTER — Encounter: Payer: Self-pay | Admitting: Podiatry

## 2020-05-01 ENCOUNTER — Other Ambulatory Visit: Payer: Self-pay | Admitting: Podiatry

## 2020-05-01 ENCOUNTER — Other Ambulatory Visit: Payer: Self-pay

## 2020-05-01 ENCOUNTER — Telehealth: Payer: Self-pay | Admitting: Podiatry

## 2020-05-01 MED ORDER — EFINACONAZOLE 10 % EX SOLN: 1.0000 "application " | Freq: Every day | CUTANEOUS | 1 refills | Status: DC

## 2020-05-01 NOTE — Telephone Encounter (Signed)
Pt called again stating he needed his Rx sent in. Please advise.

## 2020-05-02 ENCOUNTER — Encounter: Payer: Self-pay | Admitting: Podiatry

## 2020-05-04 ENCOUNTER — Telehealth: Payer: Self-pay | Admitting: *Deleted

## 2020-05-04 NOTE — Telephone Encounter (Signed)
Aldona Bar w/ [place] is requesting a verbal resend of Jublia (efinaconazole) that was originally sent thru Mychart and brought in store by patient,have not received. Please advise.   Called and gave verbal resend on prescription of Efinaconazole- 10% soln. From Epic notes dated 05/01/20.

## 2020-05-22 ENCOUNTER — Other Ambulatory Visit: Payer: Self-pay

## 2020-05-22 ENCOUNTER — Ambulatory Visit (INDEPENDENT_AMBULATORY_CARE_PROVIDER_SITE_OTHER): Payer: Medicare Other

## 2020-05-22 DIAGNOSIS — B351 Tinea unguium: Secondary | ICD-10-CM

## 2020-05-22 DIAGNOSIS — M79674 Pain in right toe(s): Secondary | ICD-10-CM

## 2020-05-22 DIAGNOSIS — M79675 Pain in left toe(s): Secondary | ICD-10-CM

## 2020-05-22 MED ORDER — CICLOPIROX 8 % EX SOLN: Freq: Every day | CUTANEOUS | 0 refills | Status: DC

## 2020-05-22 NOTE — Progress Notes (Addendum)
Patient presents today for the 1st laser treatment. Diagnosed with mycotic nail infection by Dr. Amalia Hailey.   Toenail most affected bilateral 1-5.  All other systems are negative.  Nails were filed thin. Laser therapy was administered to 1-5 toenails bilateral and patient tolerated the treatment well. All safety precautions were in place.   Patient using efinaconazole 10% solution the Rx was $180 so I sent a new Rx to pharmacy for Ciclopirox 8% solution  Follow up in 4 weeks for laser # 2.

## 2020-05-22 NOTE — Addendum Note (Signed)
Addended by: Geni Bers on: 05/22/2020 03:11 PM   Modules accepted: Orders

## 2020-05-22 NOTE — Patient Instructions (Signed)

## 2020-06-19 ENCOUNTER — Other Ambulatory Visit: Payer: Self-pay

## 2020-06-19 ENCOUNTER — Ambulatory Visit (INDEPENDENT_AMBULATORY_CARE_PROVIDER_SITE_OTHER): Payer: Medicare Other

## 2020-06-19 DIAGNOSIS — M79674 Pain in right toe(s): Secondary | ICD-10-CM

## 2020-06-19 DIAGNOSIS — B351 Tinea unguium: Secondary | ICD-10-CM

## 2020-06-19 DIAGNOSIS — M79675 Pain in left toe(s): Secondary | ICD-10-CM

## 2020-06-19 NOTE — Progress Notes (Signed)
Patient presents today for the 2nd laser treatment. Diagnosed with mycotic nail infection by Dr. Amalia Hailey.   Toenail most affected bilateral 1-5.  All other systems are negative.  Nails were filed thin. Laser therapy was administered to 1-5 toenails bilateral and patient tolerated the treatment well. All safety precautions were in place.   Patient using efinaconazole 10% solution the Rx was $180 so I sent a new Rx to pharmacy for Ciclopirox 8% solution  Follow up in 4 weeks for laser # 3.

## 2020-07-17 ENCOUNTER — Ambulatory Visit (INDEPENDENT_AMBULATORY_CARE_PROVIDER_SITE_OTHER): Payer: Self-pay

## 2020-07-17 ENCOUNTER — Other Ambulatory Visit: Payer: Self-pay

## 2020-07-17 DIAGNOSIS — M79674 Pain in right toe(s): Secondary | ICD-10-CM

## 2020-07-17 DIAGNOSIS — M79675 Pain in left toe(s): Secondary | ICD-10-CM

## 2020-07-17 DIAGNOSIS — B351 Tinea unguium: Secondary | ICD-10-CM

## 2020-07-17 NOTE — Progress Notes (Signed)
Patient presents today for the 3rd laser treatment. Diagnosed with mycotic nail infection by Dr. Amalia Hailey.   Toenail most affected bilateral 1-5.  All other systems are negative.  Nails were filed thin. Laser therapy was administered to 1-5 toenails bilateral and patient tolerated the treatment well. All safety precautions were in place.   Patient using efinaconazole 10% solution the Rx was $180 so I sent a new Rx to pharmacy for Ciclopirox 8% solution  Follow up in 4 weeks for laser # 4.

## 2020-07-17 NOTE — Patient Instructions (Signed)

## 2020-08-25 ENCOUNTER — Telehealth: Payer: Self-pay | Admitting: Emergency Medicine

## 2020-08-25 NOTE — Telephone Encounter (Signed)
Patient's father declined AWV.

## 2020-08-28 ENCOUNTER — Ambulatory Visit (INDEPENDENT_AMBULATORY_CARE_PROVIDER_SITE_OTHER): Payer: Medicare Other

## 2020-08-28 ENCOUNTER — Other Ambulatory Visit: Payer: Self-pay

## 2020-08-28 DIAGNOSIS — M79674 Pain in right toe(s): Secondary | ICD-10-CM

## 2020-08-28 DIAGNOSIS — B351 Tinea unguium: Secondary | ICD-10-CM

## 2020-08-28 DIAGNOSIS — M79675 Pain in left toe(s): Secondary | ICD-10-CM

## 2020-08-28 NOTE — Progress Notes (Signed)
Patient presents today for the 4th laser treatment. Diagnosed with mycotic nail infection by Dr. Amalia Hailey.   Toenail most affected bilateral 1-5.  All other systems are negative.  Nails were filed thin. Laser therapy was administered to 1-5 toenails bilateral and patient tolerated the treatment well. All safety precautions were in place.   Patient using efinaconazole 10% solution the Rx was $180 so I sent a new Rx to pharmacy for Ciclopirox 8% solution  Follow up in 6 weeks for laser #5 .

## 2020-10-08 ENCOUNTER — Other Ambulatory Visit: Payer: Self-pay | Admitting: Podiatry

## 2020-10-08 DIAGNOSIS — M79675 Pain in left toe(s): Secondary | ICD-10-CM

## 2020-10-08 DIAGNOSIS — M79674 Pain in right toe(s): Secondary | ICD-10-CM

## 2020-10-09 ENCOUNTER — Ambulatory Visit (HOSPITAL_COMMUNITY)
Admission: RE | Admit: 2020-10-09 | Discharge: 2020-10-09 | Disposition: A | Discharge status: Home / Self Care | Payer: Medicare Other | Source: Ambulatory Visit | Attending: Urology | Admitting: Urology

## 2020-10-09 ENCOUNTER — Other Ambulatory Visit: Payer: Self-pay

## 2020-10-09 ENCOUNTER — Other Ambulatory Visit (HOSPITAL_COMMUNITY): Payer: Self-pay | Admitting: Urology

## 2020-10-09 ENCOUNTER — Ambulatory Visit (INDEPENDENT_AMBULATORY_CARE_PROVIDER_SITE_OTHER): Payer: Self-pay | Admitting: *Deleted

## 2020-10-09 DIAGNOSIS — C6290 Malignant neoplasm of unspecified testis, unspecified whether descended or undescended: Secondary | ICD-10-CM | POA: Diagnosis not present

## 2020-10-09 DIAGNOSIS — M79674 Pain in right toe(s): Secondary | ICD-10-CM

## 2020-10-09 DIAGNOSIS — B351 Tinea unguium: Secondary | ICD-10-CM

## 2020-10-09 DIAGNOSIS — M79675 Pain in left toe(s): Secondary | ICD-10-CM

## 2020-10-09 MED ORDER — CICLOPIROX 8 % EX SOLN: Freq: Every day | CUTANEOUS | 11 refills | Status: DC

## 2020-10-09 NOTE — Progress Notes (Signed)
Patient presents today for the 5th laser treatment. Diagnosed with mycotic nail infection by Dr. Amalia Hailey.   Toenail most affected all ten nails.  All other systems are negative.  Nails were filed thin. Laser therapy was administered to 1-5 toenails bilateral and patient tolerated the treatment well. All safety precautions were in place.   Patient using ciclopirox twice a day.  Follow up in 8 weeks for laser #6

## 2020-12-03 ENCOUNTER — Other Ambulatory Visit: Payer: Self-pay

## 2020-12-03 ENCOUNTER — Ambulatory Visit (HOSPITAL_COMMUNITY)
Admission: EM | Admit: 2020-12-03 | Discharge: 2020-12-03 | Disposition: A | Discharge status: Home / Self Care | Payer: Medicare Other

## 2020-12-03 ENCOUNTER — Encounter (HOSPITAL_COMMUNITY): Payer: Self-pay | Admitting: Emergency Medicine

## 2020-12-03 DIAGNOSIS — H9311 Tinnitus, right ear: Secondary | ICD-10-CM

## 2020-12-03 NOTE — ED Provider Notes (Signed)
[facility]    CSN: [id number] Arrival date & time: 12/03/20  1423      History   Chief Complaint Chief Complaint  Patient presents with  . Ear Problem    Right      HPI Zachary Wheeler is a 45 y.o. male.   Patient presents with clicking sound occurring intermittently during periods of reading and watching television.  Started abruptly without any precipitating event . sound is not worsened by movement. denies decreased hearing or changes in hearing itching, ear pain or ringing, congestion, sinus pain or pressure, headaches, dizziness, lightheadedness.  History of Down syndrome and cancer.   Past Medical History:  Diagnosis Date  . Allergy   . Cancer ([place]) 12/02/13   seminoma  . Chicken pox   . Down syndrome    Trisomy 21  . Family history of anesthesia complication    mother has PONV  . Gall stones   . Gallstones   . Hematuria   . History of glomerulonephritis nephrologist-  dr Justin Mend   2002 resolved without kidney damage  . S/P radiation therapy 07/22/2014 through 08/14/2014                                                      Right hockey stick (periaortic/right iliac lymph nodes) 2550 cGy in 15 sessions, para aortic lymph node boost 60 cGy in 3 sessions                        . Trisomy 21     Patient Active Problem List   Diagnosis Date Noted  . Allergic rhinitis 09/29/2015  . 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 Trisomy 21   . Hx of gross hematuria     Past Surgical History:  Procedure Laterality Date  . ORCHIECTOMY Right 12/02/2013   Procedure: INGUINAL ORCHIECTOMY;  Surgeon: Jorja Loa, MD;  Location: [facility];  Service: Urology;  Laterality: Right;  . ROOT CANAL         Home Medications    Prior to Admission medications   Medication Sig Start Date End Date Taking? Authorizing Provider  ciclopirox (PENLAC) 8 % solution Apply topically at bedtime. Apply to nail/surrounding skin and daily over  previous coat. Every 7 days remove with alcohol and continue. 10/09/20   Edrick Kins, DPM  fluticasone (FLONASE) 50 MCG/ACT nasal spray Place 1 spray into both nostrils daily.    [provider]  ibuprofen (ADVIL,MOTRIN) 200 MG tablet Take 200 mg by mouth every 6 (six) hours as needed.    [provider]  pramoxine-hydrocortisone (PROCTOCREAM-HC) 1-1 % rectal cream Place 1 application rectally 2 (two) times daily. Until sxs are resolved 10/31/16   Shawnee Knapp, MD    Family History Family History  Problem Relation Age of Onset  . Hypertension Mother   . Hyperlipidemia Mother   . Hypertension Maternal Grandmother   . Emphysema Maternal Grandmother   . Hypertension Maternal Grandfather   . Alcohol abuse Maternal Grandfather   . Cancer Paternal Grandmother   . Congestive Heart Failure Paternal Grandfather     Social History Social History   Tobacco Use  . Smoking status: Never  . Smokeless tobacco: Never  Substance Use Topics  . Alcohol use: Yes  Comment: 1 beer on occas  . Drug use: No     Allergies   Codeine, Sulfa antibiotics, and Terbinafine hcl   Review of Systems Review of Systems  Constitutional: Negative.   HENT: Negative.    Respiratory: Negative.    Cardiovascular: Negative.   Skin: Negative.     Physical Exam Triage Vital Signs ED Triage Vitals  Enc Vitals Group     BP 12/03/20 1516 119/80     Pulse Rate 12/03/20 1516 76     Resp 12/03/20 1516 18     Temp 12/03/20 1516 98.1 F (36.7 C)     Temp src --      SpO2 12/03/20 1516 98 %     Weight --      Height --      Head Circumference --      Peak Flow --      Pain Score 12/03/20 1518 0     Pain Loc --      Pain Edu? --      Excl. in [place]? --    No data found.  Updated Vital Signs BP 119/80   Pulse 76   Temp 98.1 F (36.7 C)   Resp 18   SpO2 98%   Visual Acuity Right Eye Distance:   Left Eye Distance:   Bilateral Distance:    Right Eye Near:   Left Eye Near:     Bilateral Near:     Physical Exam Constitutional:      Appearance: Normal appearance. He is normal weight.  HENT:     Head: Normocephalic.     Right Ear: Tympanic membrane, ear canal and external ear normal.     Left Ear: Tympanic membrane, ear canal and external ear normal.     Nose: Nose normal.  Pulmonary:     Effort: Pulmonary effort is normal.  Skin:    General: Skin is warm and dry.  Neurological:     Mental Status: He is alert and oriented to person, place, and time. Mental status is at baseline.  Psychiatric:        Mood and Affect: Mood normal.        Behavior: Behavior normal.     UC Treatments / Results  Labs (all labs ordered are listed, but only abnormal results are displayed) Labs Reviewed - No data to display  EKG   Radiology No results found.  Procedures Procedures (including critical care time)  Medications Ordered in UC Medications - No data to display  Initial Impression / Assessment and Plan / UC Course  I have reviewed the triage vital signs and the nursing notes.  Pertinent labs & imaging results that were available during my care of the patient were reviewed by me and considered in my medical decision making (see chart for details).  Clicking of right ear  No signs of infection or structural abnormalities, discussed with patient and accompanying guest, advised follow-up with primary care doctor if symptoms continue to persist, given referral for ENT specialist as needed   Final Clinical Impressions(s) / UC Diagnoses   Final diagnoses:  Clicking tinnitus of right ear     Discharge Instructions      There were no signs of infection or structural abnormalities to your ear today therefore if symptoms continue to persist I would like you to follow-up with your primary care doctor for further evaluation  If needed I have given you information for ear nose and throat specialist that you  may also follow-up with for further  evaluation   ED Prescriptions   None    PDMP not reviewed this encounter.   Hans [place], [place] 12/03/20 [phone]

## 2020-12-03 NOTE — ED Triage Notes (Addendum)
Pt is present today with right ear problems. Pt states that he hears a clicking noise. Pt denies any pain or ringing

## 2020-12-03 NOTE — Discharge Instructions (Addendum)
There were no signs of infection or structural abnormalities to your ear today therefore if symptoms continue to persist I would like you to follow-up with your primary care doctor for further evaluation  If needed I have given you information for ear nose and throat specialist that you may also follow-up with for further evaluation

## 2020-12-04 ENCOUNTER — Ambulatory Visit (INDEPENDENT_AMBULATORY_CARE_PROVIDER_SITE_OTHER): Payer: Medicare Other | Admitting: *Deleted

## 2020-12-04 DIAGNOSIS — M79675 Pain in left toe(s): Secondary | ICD-10-CM

## 2020-12-04 DIAGNOSIS — M79674 Pain in right toe(s): Secondary | ICD-10-CM

## 2020-12-04 DIAGNOSIS — B351 Tinea unguium: Secondary | ICD-10-CM

## 2020-12-04 NOTE — Progress Notes (Signed)
Patient presents today for the 6th laser treatment. Diagnosed with mycotic nail infection by Dr. Amalia Hailey.   Toenail most affected all ten nails.  All other systems are negative.  Nails were filed thin. Laser therapy was administered to 1-5 toenails bilateral and patient tolerated the treatment well. All safety precautions were in place.   Patient using ciclopirox twice a day. He having a lot of trouble getting the excess off each week, so advised to use twice a week.  Patient has completed the recommended laser treatments. He will follow up with Dr. Amalia Hailey in 3 months to evaluate progress.

## 2021-03-08 ENCOUNTER — Other Ambulatory Visit: Payer: Self-pay | Admitting: Podiatry

## 2021-03-08 ENCOUNTER — Ambulatory Visit: Payer: Medicare Other | Admitting: Podiatry

## 2021-03-08 ENCOUNTER — Other Ambulatory Visit: Payer: Self-pay

## 2021-03-08 DIAGNOSIS — Z79899 Other long term (current) drug therapy: Secondary | ICD-10-CM | POA: Diagnosis not present

## 2021-03-08 DIAGNOSIS — M79674 Pain in right toe(s): Secondary | ICD-10-CM | POA: Diagnosis not present

## 2021-03-08 DIAGNOSIS — B351 Tinea unguium: Secondary | ICD-10-CM | POA: Diagnosis not present

## 2021-03-08 DIAGNOSIS — M79675 Pain in left toe(s): Secondary | ICD-10-CM | POA: Diagnosis not present

## 2021-03-08 MED ORDER — TERBINAFINE HCL 250 MG PO TABS: 250.0000 mg | ORAL_TABLET | Freq: Every day | ORAL | 0 refills | Status: DC

## 2021-03-08 NOTE — Progress Notes (Signed)
° °  Subjective: 46 y.o. male presenting today for follow-up evaluation of onychomycosis to the bilateral feet times several years.  The patient has now gone through approximately 5 treatments of antifungal laser as well and is applying topical antifungal with no improvement.  He is open to trying the oral antifungal medication.  He does state that several years ago he did have a reaction to oral antifungal.  He cannot remember what kind of reaction he had.  This was several years ago.  He would like to try it again.  Past Medical History:  Diagnosis Date   Allergy    Cancer ([place]) 12/02/13   seminoma   Chicken pox    Down syndrome    Trisomy 21   Family history of anesthesia complication    mother has PONV   Gall stones    Gallstones    Hematuria    History of glomerulonephritis nephrologist-  dr Justin Mend   2002 resolved without kidney damage   S/P radiation therapy 07/22/2014 through 08/14/2014                                                      Right hockey stick (periaortic/right iliac lymph nodes) 2550 cGy in 15 sessions, para aortic lymph node boost 60 cGy in 3 sessions                         Trisomy 21     Objective: Physical Exam General: The patient is alert and oriented x3 in no acute distress.  Dermatology: Hyperkeratotic, discolored, thickened, onychodystrophy noted 1-5 bilateral. Skin is warm, dry and supple bilateral lower extremities. Negative for open lesions or macerations.  Vascular: Palpable pedal pulses bilaterally. No edema or erythema noted. Capillary refill within normal limits.  Neurological: Epicritic and protective threshold grossly intact bilaterally.   Musculoskeletal Exam: Range of motion within normal limits to all pedal and ankle joints bilateral. Muscle strength 5/5 in all groups bilateral.   Assessment: #1 Onychomycosis of toenails both feet  Plan of Care:  #1 Patient was evaluated. #2  Today we discussed different treatment options including oral,  topical, and laser antifungal treatment modalities.  We discussed their efficacies and side effects.  Patient opts for oral antifungal treatment modality.  The patient has now tried the laser and topical antifungal with no improvement. #3 prescription for Lamisil 250 mg #90 daily.  he denies a history of liver pathology or symptoms.   #4  Order placed for hepatic function panel.  Discontinue if abnormal  #5 return to clinic 6 months   Edrick Kins, DPM [facility]  Dr. Edrick Kins, DPM    [address], [place] [address]                Office [phone]  Fax [phone]

## 2021-03-09 LAB — HEPATIC FUNCTION PANEL (6)
ALT: 28 IU/L (ref 0–44)
AST: 22 IU/L (ref 0–40)
Albumin: 4.2 g/dL (ref 4.0–5.0)
Alkaline Phosphatase: 91 IU/L (ref 44–121)
Bilirubin Total: 0.4 mg/dL (ref 0.0–1.2)
Bilirubin, Direct: 0.14 mg/dL (ref 0.00–0.40)

## 2021-04-19 ENCOUNTER — Ambulatory Visit (INDEPENDENT_AMBULATORY_CARE_PROVIDER_SITE_OTHER): Payer: Medicare Other | Admitting: Emergency Medicine

## 2021-04-19 ENCOUNTER — Encounter: Payer: Self-pay | Admitting: Emergency Medicine

## 2021-04-19 ENCOUNTER — Other Ambulatory Visit: Payer: Self-pay

## 2021-04-19 VITALS — BP 108/72 | HR 78 | Temp 98.1°F | Ht 60.0 in | Wt 158.2 lb

## 2021-04-19 DIAGNOSIS — Z1329 Encounter for screening for other suspected endocrine disorder: Secondary | ICD-10-CM | POA: Diagnosis not present

## 2021-04-19 DIAGNOSIS — Z0000 Encounter for general adult medical examination without abnormal findings: Secondary | ICD-10-CM | POA: Diagnosis not present

## 2021-04-19 DIAGNOSIS — Z130 Encounter for screening for diseases of the blood and blood-forming organs and certain disorders involving the immune mechanism: Secondary | ICD-10-CM | POA: Diagnosis not present

## 2021-04-19 DIAGNOSIS — Z1159 Encounter for screening for other viral diseases: Secondary | ICD-10-CM

## 2021-04-19 DIAGNOSIS — Z13220 Encounter for screening for lipoid disorders: Secondary | ICD-10-CM | POA: Diagnosis not present

## 2021-04-19 DIAGNOSIS — Z114 Encounter for screening for human immunodeficiency virus [HIV]: Secondary | ICD-10-CM | POA: Diagnosis not present

## 2021-04-19 DIAGNOSIS — Q909 Down syndrome, unspecified: Secondary | ICD-10-CM

## 2021-04-19 DIAGNOSIS — Z13228 Encounter for screening for other metabolic disorders: Secondary | ICD-10-CM | POA: Diagnosis not present

## 2021-04-19 LAB — CBC WITH DIFFERENTIAL/PLATELET
Basophils Absolute: 0.1 10*3/uL (ref 0.0–0.1)
Basophils Relative: 2.1 % (ref 0.0–3.0)
Eosinophils Absolute: 0 10*3/uL (ref 0.0–0.7)
Eosinophils Relative: 1 % (ref 0.0–5.0)
HCT: 43 % (ref 39.0–52.0)
Hemoglobin: 14.4 g/dL (ref 13.0–17.0)
Lymphocytes Relative: 23.9 % (ref 12.0–46.0)
Lymphs Abs: 1.1 10*3/uL (ref 0.7–4.0)
MCHC: 33.5 g/dL (ref 30.0–36.0)
MCV: 94.1 fl (ref 78.0–100.0)
Monocytes Absolute: 0.5 10*3/uL (ref 0.1–1.0)
Monocytes Relative: 11.3 % (ref 3.0–12.0)
Neutro Abs: 2.9 10*3/uL (ref 1.4–7.7)
Neutrophils Relative %: 61.7 % (ref 43.0–77.0)
Platelets: 198 10*3/uL (ref 150.0–400.0)
RBC: 4.57 Mil/uL (ref 4.22–5.81)
RDW: 13.4 % (ref 11.5–15.5)
WBC: 4.6 10*3/uL (ref 4.0–10.5)

## 2021-04-19 LAB — LIPID PANEL
Cholesterol: 228 mg/dL — ABNORMAL HIGH (ref 0–200)
HDL: 44.8 mg/dL (ref >39.00)
NonHDL: 182.95
Total CHOL/HDL Ratio: 5
Triglycerides: 207 mg/dL — ABNORMAL HIGH (ref 0.0–149.0)
VLDL: 41.4 mg/dL — ABNORMAL HIGH (ref 0.0–40.0)

## 2021-04-19 LAB — LDL CHOLESTEROL, DIRECT: Direct LDL: 131 mg/dL

## 2021-04-19 LAB — COMPREHENSIVE METABOLIC PANEL
ALT: 19 U/L (ref 0–53)
AST: 18 U/L (ref 0–37)
Albumin: 3.8 g/dL (ref 3.5–5.2)
Alkaline Phosphatase: 76 U/L (ref 39–117)
BUN: 15 mg/dL (ref 6–23)
CO2: 30 mEq/L (ref 19–32)
Calcium: 9.1 mg/dL (ref 8.4–10.5)
Chloride: 103 mEq/L (ref 96–112)
Creatinine, Ser: 1 mg/dL (ref 0.40–1.50)
GFR: 91.22 mL/min (ref >60.00)
Glucose, Bld: 89 mg/dL (ref 70–99)
Potassium: 4.4 mEq/L (ref 3.5–5.1)
Sodium: 139 mEq/L (ref 135–145)
Total Bilirubin: 0.4 mg/dL (ref 0.2–1.2)
Total Protein: 7.2 g/dL (ref 6.0–8.3)

## 2021-04-19 LAB — HEMOGLOBIN A1C: Hgb A1c MFr Bld: 5.5 % (ref 4.6–6.5)

## 2021-04-19 NOTE — Progress Notes (Signed)
Zachary Wheeler 46 y.o.   Chief Complaint  Patient presents with   Annual Exam   spot on face     HISTORY OF PRESENT ILLNESS: This is a 46 y.o. male here for annual exam. Has history of Down syndrome. History of testicular cancer operated on in 2016.  Still gets annual follow-ups.  Doing well. Healthy lifestyle. No complaints or medical concerns today.  HPI   Prior to Admission medications   Medication Sig Start Date End Date Taking? Authorizing Provider  ciclopirox (PENLAC) 8 % solution Apply topically at bedtime. Apply to nail/surrounding skin and daily over previous coat. Every 7 days remove with alcohol and continue. 10/09/20  Yes Edrick Kins, DPM  fluticasone (FLONASE) 50 MCG/ACT nasal spray Place 1 spray into both nostrils daily.   Yes [provider]  ibuprofen (ADVIL,MOTRIN) 200 MG tablet Take 200 mg by mouth every 6 (six) hours as needed.   Yes [provider]  pramoxine-hydrocortisone (PROCTOCREAM-HC) 1-1 % rectal cream Place 1 application rectally 2 (two) times daily. Until sxs are resolved 10/31/16  Yes Shawnee Knapp, MD  terbinafine (LAMISIL) 250 MG tablet Take 1 tablet (250 mg total) by mouth daily. 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   Trisomy 21    Hx of gross hematuria     Past Medical History:  Diagnosis Date   Allergy    Cancer ([place]) 12/02/13   seminoma   Chicken pox    Down syndrome    Trisomy 21   Family history of anesthesia complication    mother has PONV   Gall stones    Gallstones    Hematuria    History of glomerulonephritis nephrologist-  dr Justin Mend   2002 resolved without kidney damage   S/P radiation therapy 07/22/2014 through 08/14/2014                                                       Right hockey stick (periaortic/right iliac lymph nodes) 2550 cGy in 15 sessions, para aortic lymph node boost 60 cGy in 3 sessions                         Trisomy 21     Past Surgical History:  Procedure Laterality Date   ORCHIECTOMY Right 12/02/2013   Procedure: INGUINAL ORCHIECTOMY;  Surgeon: Jorja Loa, MD;  Location: [facility];  Service: Urology;  Laterality: Right;   ROOT CANAL      Social History   Socioeconomic History   Marital status: Single    Spouse name: Not on file   Number of children: Not on file   Years of education: 12   Highest education level: Not on file  Occupational History   Occupation: janitor  Tobacco Use   Smoking status: Never   Smokeless tobacco: Never  Substance and Sexual Activity   Alcohol use: Yes    Comment: 1 beer on occas   Drug use: No   Sexual activity: Never  Other Topics Concern   Not on file  Social History Narrative   Exercises regularly.   Social Determinants of Health   Financial Resource Strain: Not on file  Food Insecurity: Not on file  Transportation Needs: Not on file  Physical Activity: Not on file  Stress: Not on file  Social Connections: Not on file  Intimate Partner Violence: Not on file    Family History  Problem Relation Age of Onset   Hypertension Mother    Hyperlipidemia Mother    Hypertension Maternal Grandmother    Emphysema Maternal Grandmother    Hypertension Maternal Grandfather    Alcohol abuse Maternal Grandfather    Cancer Paternal Grandmother    Congestive Heart Failure Paternal Grandfather      Review of Systems  Constitutional: Negative.  Negative for chills and fever.  HENT: Negative.  Negative for congestion and sore throat.   Respiratory: Negative.  Negative for cough and shortness of breath.   Cardiovascular: Negative.  Negative for chest pain and palpitations.  Gastrointestinal: Negative.  Negative for abdominal pain, diarrhea, nausea and  vomiting.  Genitourinary: Negative.  Negative for dysuria and hematuria.  Musculoskeletal: Negative.   Skin: Negative.  Negative for rash.  Neurological: Negative.  Negative for dizziness and headaches.  All other systems reviewed and are negative. Today's Vitals   04/19/21 0838  BP: 108/72  Pulse: 78  Temp: 98.1 F (36.7 C)  TempSrc: Oral  SpO2: 99%  Weight: 158 lb 4 oz (71.8 kg)  Height: 5' (1.524 m)   Body mass index is 30.91 kg/m.   Physical Exam Vitals reviewed.  Constitutional:      Appearance: Normal appearance.  HENT:     Head: Normocephalic.     Right Ear: Tympanic membrane, ear canal and external ear normal.     Left Ear: Tympanic membrane, ear canal and external ear normal.     Mouth/Throat:     Mouth: Mucous membranes are moist.     Pharynx: Oropharynx is clear.  Eyes:     Extraocular Movements: Extraocular movements intact.     Conjunctiva/sclera: Conjunctivae normal.     Pupils: Pupils are equal, round, and reactive to light.  Cardiovascular:     Rate and Rhythm: Normal rate and regular rhythm.     Pulses: Normal pulses.     Heart sounds: Normal heart sounds.  Pulmonary:     Effort: Pulmonary effort is normal.     Breath sounds: Normal breath sounds.  Abdominal:     General: There is no distension.     Palpations: Abdomen is soft. There is no mass.     Tenderness: There is no abdominal tenderness.  Musculoskeletal:        General: Normal range of motion.     Cervical back: Normal range of motion and neck supple.  Skin:    General: Skin is warm and dry.     Capillary Refill: Capillary refill takes less than 2 seconds.  Neurological:     General: No focal deficit present.     Mental Status: He is alert and oriented to person, place, and time.  Psychiatric:        Mood and Affect: Mood normal.        Behavior: Behavior normal.     ASSESSMENT & PLAN: Problem List Items Addressed This Visit   None Visit Diagnoses     Routine general medical  examination at a health care facility    -  Primary  Need for hepatitis C screening test       Relevant Orders   Hepatitis C antibody screen   Screening for HIV (human immunodeficiency virus)       Relevant Orders   HIV antibody   Screening for deficiency anemia       Relevant Orders   CBC with Differential   Screening for lipoid disorders       Relevant Orders   Lipid panel   Screening for endocrine, metabolic and immunity disorder       Relevant Orders   Comprehensive metabolic panel   Hemoglobin A1c   Down syndrome          Modifiable risk factors discussed with patient. Anticipatory guidance according to age provided. The following topics were also discussed: Social Determinants of Health Smoking.  Non-smoker Diet and nutrition Benefits of exercise Cancer family history review Vaccinations recommendations Cardiovascular risk assessment Mental health including depression and anxiety Fall and accident prevention  Patient Instructions  Health Maintenance, Male Adopting a healthy lifestyle and getting preventive care are important in promoting health and wellness. Ask your health care provider about: The right schedule for you to have regular tests and exams. Things you can do on your own to prevent diseases and keep yourself healthy. What should I know about diet, weight, and exercise? Eat a healthy diet  Eat a diet that includes plenty of vegetables, fruits, low-fat dairy products, and lean protein. Do not eat a lot of foods that are high in solid fats, added sugars, or sodium. Maintain a healthy weight Body mass index (BMI) is a measurement that can be used to identify possible weight problems. It estimates body fat based on height and weight. Your health care provider can help determine your BMI and help you achieve or maintain a healthy weight. Get regular exercise Get regular exercise. This is one of the most important things you can do for your health. Most  adults should: Exercise for at least 150 minutes each week. The exercise should increase your heart rate and make you sweat (moderate-intensity exercise). Do strengthening exercises at least twice a week. This is in addition to the moderate-intensity exercise. Spend less time sitting. Even light physical activity can be beneficial. Watch cholesterol and blood lipids Have your blood tested for lipids and cholesterol at 45 years of age, then have this test every 5 years. You may need to have your cholesterol levels checked more often if: Your lipid or cholesterol levels are high. You are older than 45 years of age. You are at high risk for heart disease. What should I know about cancer screening? Many types of cancers can be detected early and may often be prevented. Depending on your health history and family history, you may need to have cancer screening at various ages. This may include screening for: Colorectal cancer. Prostate cancer. Skin cancer. Lung cancer. What should I know about heart disease, diabetes, and high blood pressure? Blood pressure and heart disease High blood pressure causes heart disease and increases the risk of stroke. This is more likely to develop in people who have high blood pressure readings or are overweight. Talk with your health care provider about your target blood pressure readings. Have your blood pressure checked: Every 3-5 years if you are 44-89 years of age. Every year if you are 64 years old or older. If you are between the ages of 36 and 83 and are a current or former smoker, ask your health care provider  if you should have a one-time screening for abdominal aortic aneurysm (AAA). Diabetes Have regular diabetes screenings. This checks your fasting blood sugar level. Have the screening done: Once every three years after age 49 if you are at a normal weight and have a low risk for diabetes. More often and at a younger age if you are overweight or have  a high risk for diabetes. What should I know about preventing infection? Hepatitis B If you have a higher risk for hepatitis B, you should be screened for this virus. Talk with your health care provider to find out if you are at risk for hepatitis B infection. Hepatitis C Blood testing is recommended for: Everyone born from [birth year] through 1964. Anyone with known risk factors for hepatitis C. Sexually transmitted infections (STIs) You should be screened each year for STIs, including gonorrhea and chlamydia, if: You are sexually active and are younger than 45 years of age. You are older than 45 years of age and your health care provider tells you that you are at risk for this type of infection. Your sexual activity has changed since you were last screened, and you are at increased risk for chlamydia or gonorrhea. Ask your health care provider if you are at risk. Ask your health care provider about whether you are at high risk for HIV. Your health care provider may recommend a prescription medicine to help prevent HIV infection. If you choose to take medicine to prevent HIV, you should first get tested for HIV. You should then be tested every 3 months for as long as you are taking the medicine. Follow these instructions at home: Alcohol use Do not drink alcohol if your health care provider tells you not to drink. If you drink alcohol: Limit how much you have to 0-2 drinks a day. Know how much alcohol is in your drink. In the [place] one drink equals one 12 oz bottle of beer (355 mL), one 5 oz glass of wine (148 mL), or one 1 oz glass of hard liquor (44 mL). Lifestyle Do not use any products that contain nicotine or tobacco. These products include cigarettes, chewing tobacco, and vaping devices, such as e-cigarettes. If you need help quitting, ask your health care provider. Do not use street drugs. Do not share needles. Ask your health care provider for help if you need support or information about  quitting drugs. General instructions Schedule regular health, dental, and eye exams. Stay current with your vaccines. Tell your health care provider if: You often feel depressed. You have ever been abused or do not feel safe at home. Summary Adopting a healthy lifestyle and getting preventive care are important in promoting health and wellness. Follow your health care provider's instructions about healthy diet, exercising, and getting tested or screened for diseases. Follow your health care provider's instructions on monitoring your cholesterol and blood pressure. This information is not intended to replace advice given to you by your health care provider. Make sure you discuss any questions you have with your health care provider. Document Revised: 06/29/2020 Document Reviewed: 06/29/2020 Elsevier Patient Education  2022 [place], MD [facility]

## 2021-04-19 NOTE — Patient Instructions (Signed)

## 2021-04-20 LAB — HEPATITIS C ANTIBODY
Hepatitis C Ab: NONREACTIVE
SIGNAL TO CUT-OFF: 0.07 (ref <1.00)

## 2021-04-20 LAB — HIV ANTIBODY (ROUTINE TESTING W REFLEX): HIV 1&2 Ab, 4th Generation: NONREACTIVE

## 2021-04-21 DIAGNOSIS — H5213 Myopia, bilateral: Secondary | ICD-10-CM | POA: Diagnosis not present

## 2021-04-21 DIAGNOSIS — H10413 Chronic giant papillary conjunctivitis, bilateral: Secondary | ICD-10-CM | POA: Diagnosis not present

## 2021-04-21 DIAGNOSIS — H0102B Squamous blepharitis left eye, upper and lower eyelids: Secondary | ICD-10-CM | POA: Diagnosis not present

## 2021-04-21 DIAGNOSIS — H0102A Squamous blepharitis right eye, upper and lower eyelids: Secondary | ICD-10-CM | POA: Diagnosis not present

## 2021-04-21 DIAGNOSIS — H2513 Age-related nuclear cataract, bilateral: Secondary | ICD-10-CM | POA: Diagnosis not present

## 2021-10-13 ENCOUNTER — Ambulatory Visit (HOSPITAL_COMMUNITY)
Admission: RE | Admit: 2021-10-13 | Discharge: 2021-10-13 | Disposition: A | Discharge status: Home / Self Care | Payer: Medicare Other | Source: Ambulatory Visit | Attending: Urology | Admitting: Urology

## 2021-10-13 ENCOUNTER — Other Ambulatory Visit (HOSPITAL_COMMUNITY): Payer: Self-pay | Admitting: Urology

## 2021-10-13 DIAGNOSIS — Z8547 Personal history of malignant neoplasm of testis: Secondary | ICD-10-CM

## 2022-03-03 IMAGING — DX DG CHEST 2V
2 series · 2 of 2 positions shown · non-contrast
Comparison: September 20, 2019

CLINICAL DATA: Testicular cancer.  Seminoma in 1620.

EXAM:
CHEST - 2 VIEW

[chest pa]
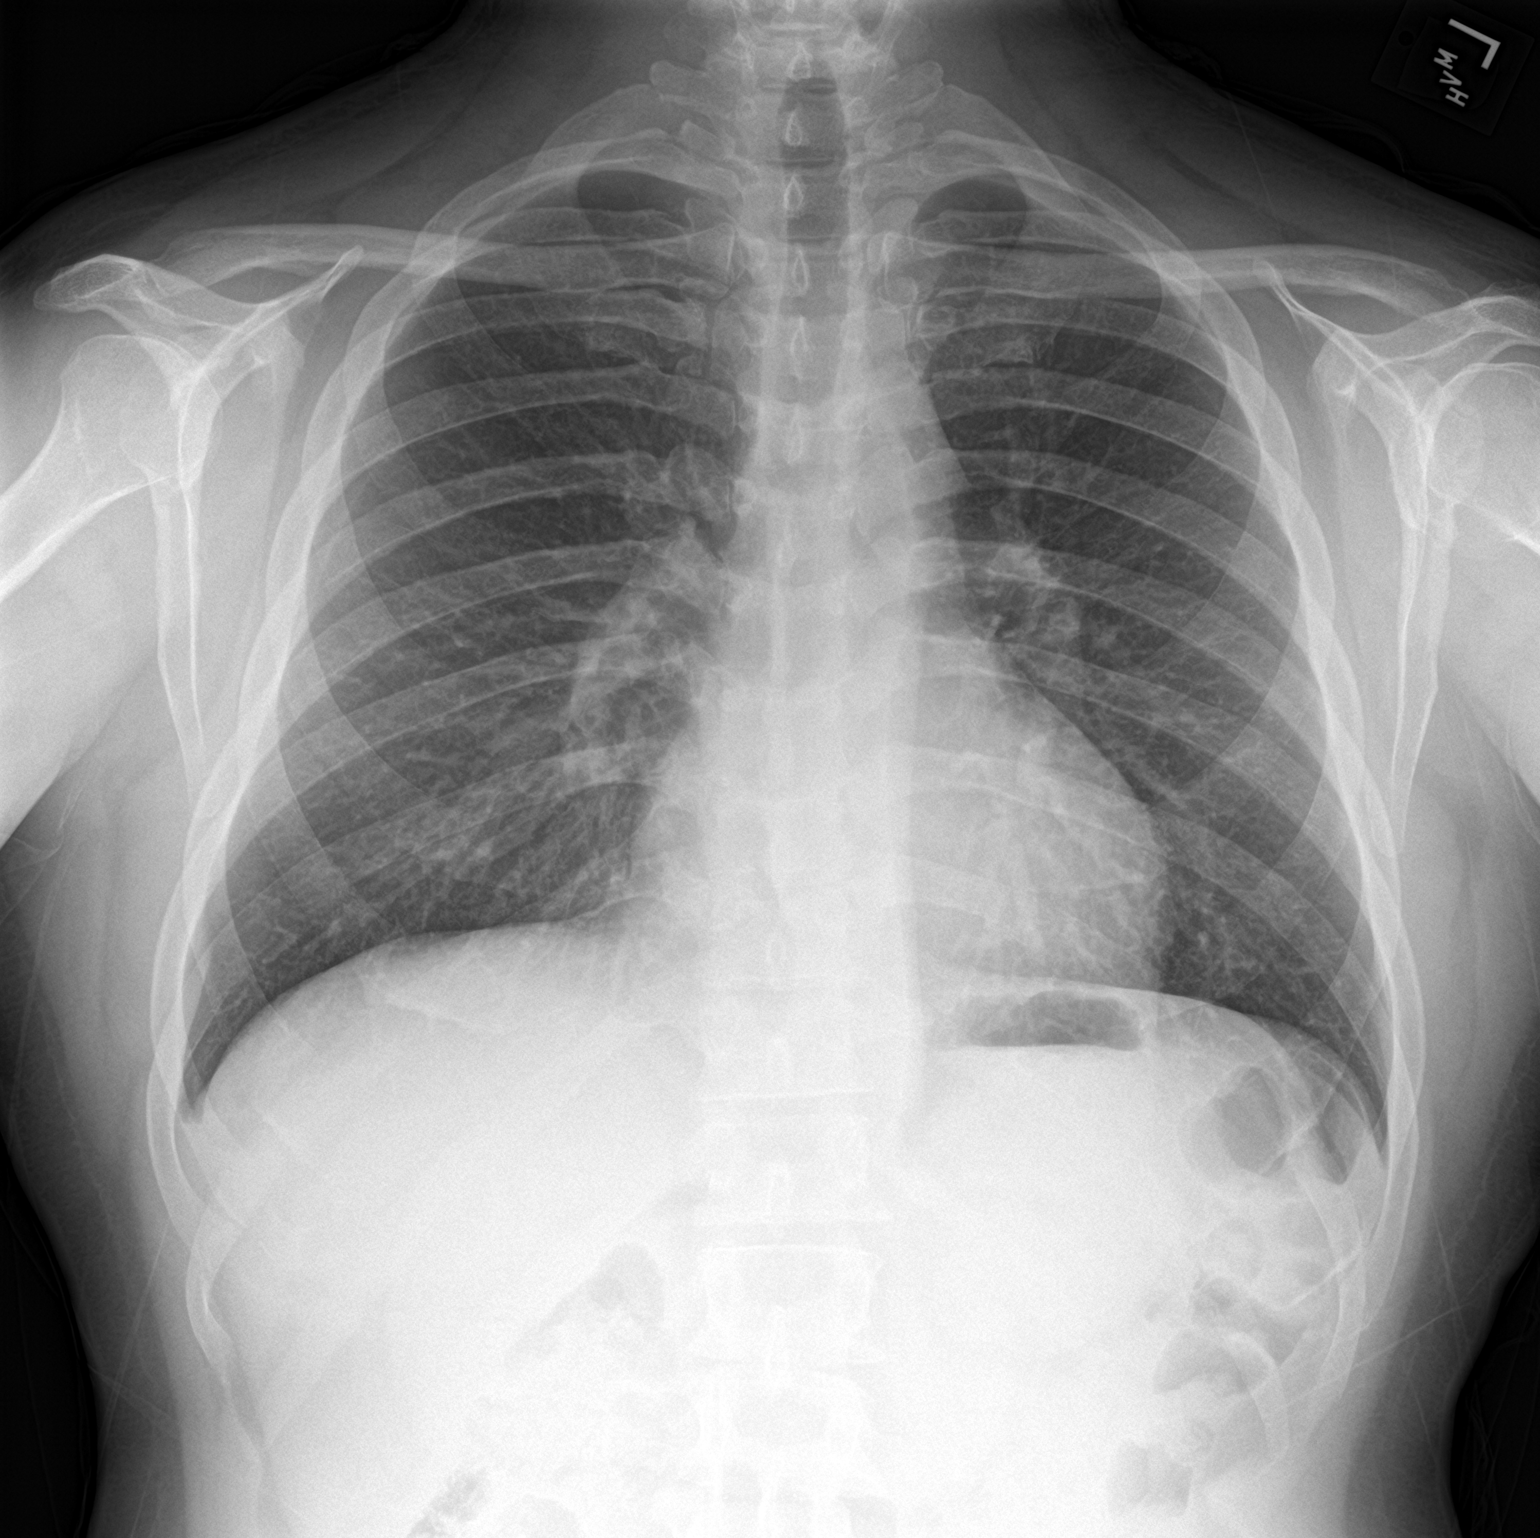

[chest lat]
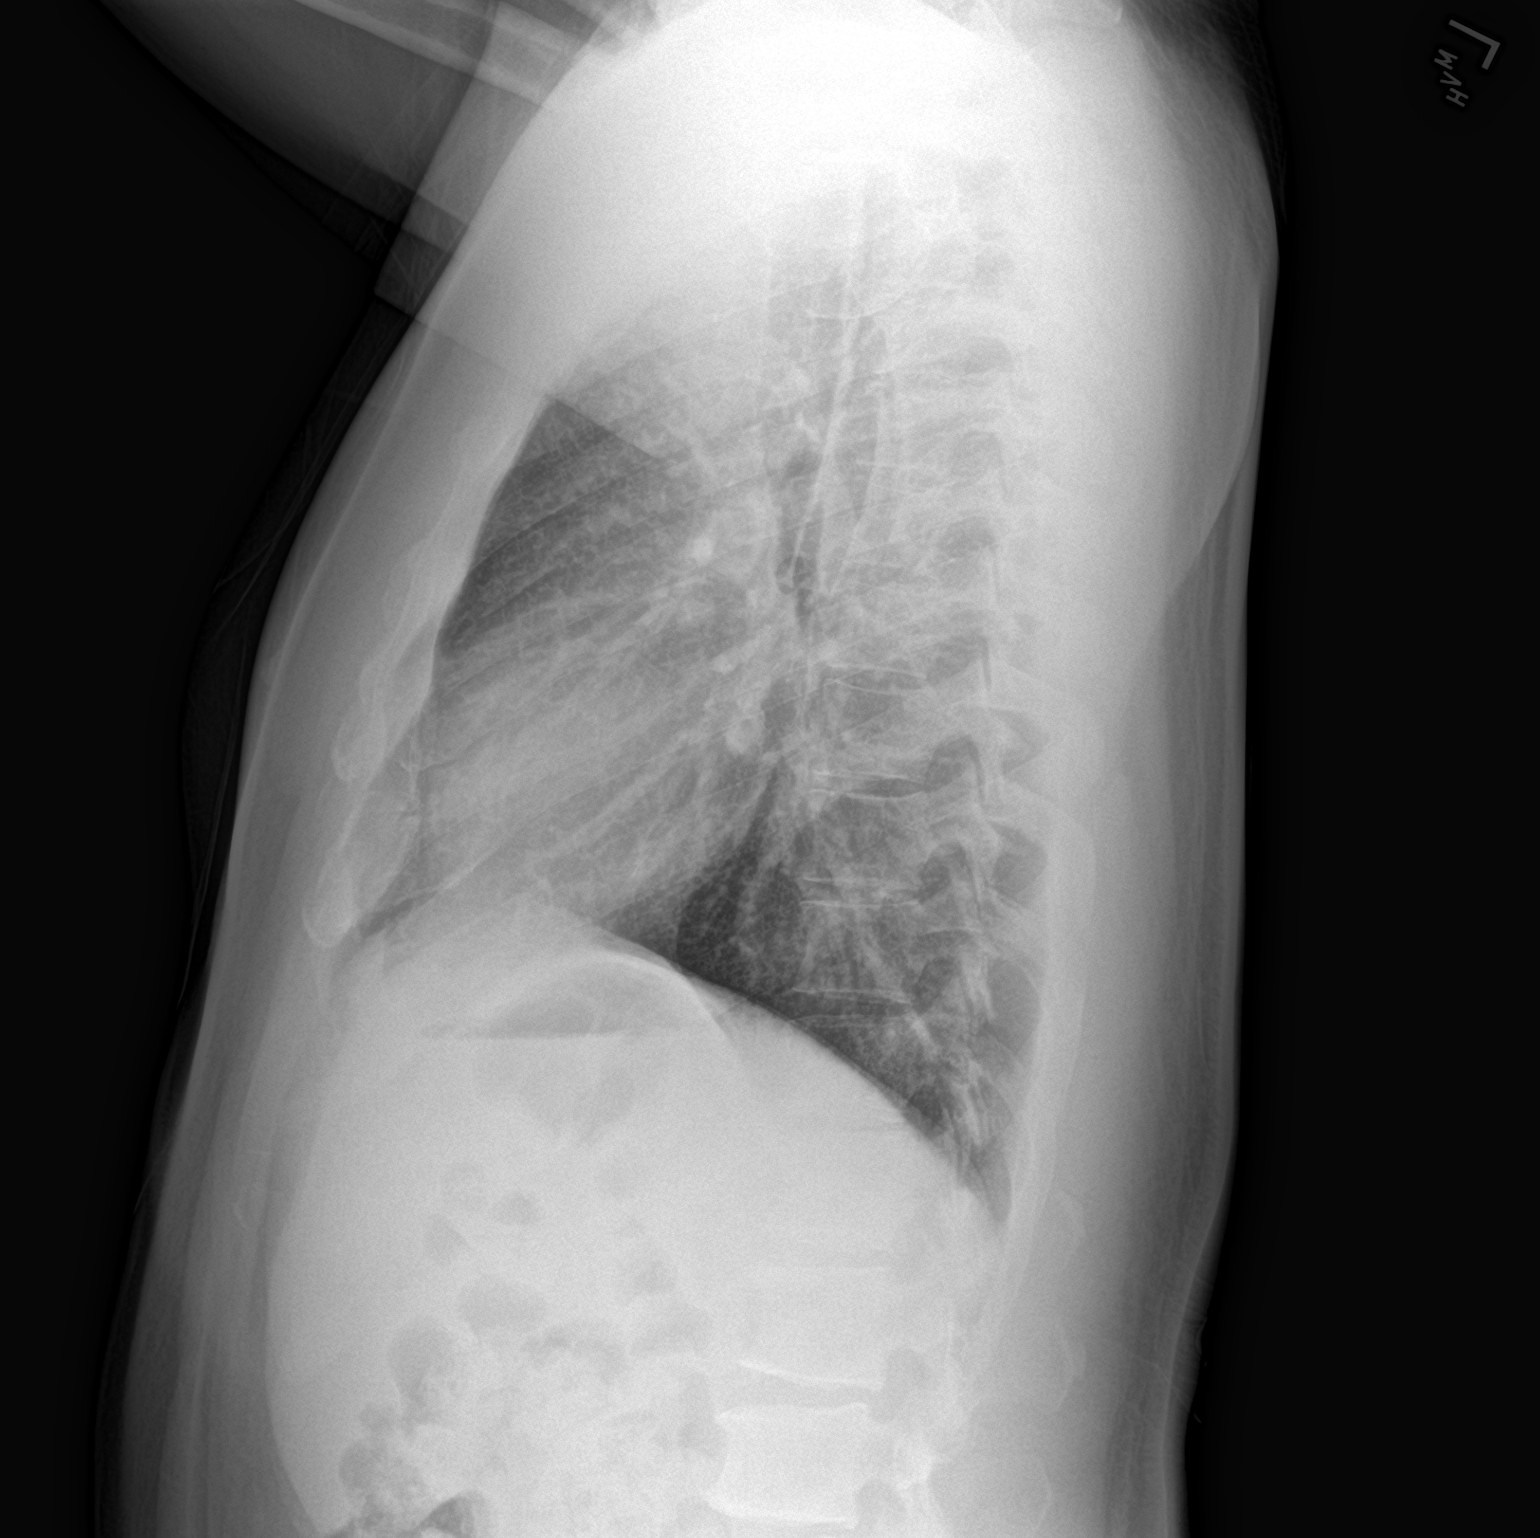

[2 of 2 positions shown; findings below may reference images not displayed]

FINDINGS: The heart size and mediastinal contours are within normal limits.
Both lungs are clear. The visualized skeletal structures are
unremarkable.
IMPRESSION: No active cardiopulmonary disease.

## 2022-04-25 ENCOUNTER — Ambulatory Visit (INDEPENDENT_AMBULATORY_CARE_PROVIDER_SITE_OTHER): Payer: Medicare Other | Admitting: Emergency Medicine

## 2022-04-25 ENCOUNTER — Encounter: Payer: Self-pay | Admitting: Emergency Medicine

## 2022-04-25 VITALS — BP 120/76 | HR 62 | Temp 98.2°F | Ht 60.0 in | Wt 160.0 lb

## 2022-04-25 DIAGNOSIS — Z789 Other specified health status: Secondary | ICD-10-CM

## 2022-04-25 DIAGNOSIS — Z13228 Encounter for screening for other metabolic disorders: Secondary | ICD-10-CM

## 2022-04-25 DIAGNOSIS — Z13220 Encounter for screening for lipoid disorders: Secondary | ICD-10-CM | POA: Diagnosis not present

## 2022-04-25 DIAGNOSIS — Z1329 Encounter for screening for other suspected endocrine disorder: Secondary | ICD-10-CM | POA: Diagnosis not present

## 2022-04-25 DIAGNOSIS — Z130 Encounter for screening for diseases of the blood and blood-forming organs and certain disorders involving the immune mechanism: Secondary | ICD-10-CM | POA: Diagnosis not present

## 2022-04-25 DIAGNOSIS — Z0000 Encounter for general adult medical examination without abnormal findings: Secondary | ICD-10-CM

## 2022-04-25 LAB — COMPREHENSIVE METABOLIC PANEL
ALT: 22 U/L (ref 0–53)
AST: 18 U/L (ref 0–37)
Albumin: 3.6 g/dL (ref 3.5–5.2)
Alkaline Phosphatase: 72 U/L (ref 39–117)
BUN: 14 mg/dL (ref 6–23)
CO2: 30 mEq/L (ref 19–32)
Calcium: 9.3 mg/dL (ref 8.4–10.5)
Chloride: 104 mEq/L (ref 96–112)
Creatinine, Ser: 0.98 mg/dL (ref 0.40–1.50)
GFR: 92.8 mL/min (ref >60.00)
Glucose, Bld: 93 mg/dL (ref 70–99)
Potassium: 4.9 mEq/L (ref 3.5–5.1)
Sodium: 140 mEq/L (ref 135–145)
Total Bilirubin: 0.5 mg/dL (ref 0.2–1.2)
Total Protein: 6.9 g/dL (ref 6.0–8.3)

## 2022-04-25 LAB — CBC WITH DIFFERENTIAL/PLATELET
Basophils Absolute: 0.1 10*3/uL (ref 0.0–0.1)
Basophils Relative: 0.9 % (ref 0.0–3.0)
Eosinophils Absolute: 0 10*3/uL (ref 0.0–0.7)
Eosinophils Relative: 0.8 % (ref 0.0–5.0)
HCT: 45 % (ref 39.0–52.0)
Hemoglobin: 15.3 g/dL (ref 13.0–17.0)
Lymphocytes Relative: 22.7 % (ref 12.0–46.0)
Lymphs Abs: 1.4 10*3/uL (ref 0.7–4.0)
MCHC: 34 g/dL (ref 30.0–36.0)
MCV: 93.5 fl (ref 78.0–100.0)
Monocytes Absolute: 0.6 10*3/uL (ref 0.1–1.0)
Monocytes Relative: 10.4 % (ref 3.0–12.0)
Neutro Abs: 3.9 10*3/uL (ref 1.4–7.7)
Neutrophils Relative %: 65.2 % (ref 43.0–77.0)
Platelets: 226 10*3/uL (ref 150.0–400.0)
RBC: 4.82 Mil/uL (ref 4.22–5.81)
RDW: 14 % (ref 11.5–15.5)
WBC: 6 10*3/uL (ref 4.0–10.5)

## 2022-04-25 LAB — LIPID PANEL
Cholesterol: 205 mg/dL — ABNORMAL HIGH (ref 0–200)
HDL: 43.5 mg/dL (ref >39.00)
LDL Cholesterol: 132 mg/dL — ABNORMAL HIGH (ref 0–99)
NonHDL: 161.11
Total CHOL/HDL Ratio: 5
Triglycerides: 146 mg/dL (ref 0.0–149.0)
VLDL: 29.2 mg/dL (ref 0.0–40.0)

## 2022-04-25 LAB — HEMOGLOBIN A1C: Hgb A1c MFr Bld: 5.4 % (ref 4.6–6.5)

## 2022-04-25 NOTE — Progress Notes (Signed)
Zachary Wheeler 47 y.o.   Chief Complaint  Patient presents with   Annual Exam    Mealses shot? And check his ears have a hard time hearing    HISTORY OF PRESENT ILLNESS: This is a 47 y.o. male here for annual exam. Asking about measles. At times he has a hard time hearing Symptom he gets generalized sharp body aches mostly at night lasting several minutes.  None during the day. No other complaints or medical concerns today. 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  HPI   Prior to Admission medications   Medication Sig Start Date End Date Taking? Authorizing Provider  ciclopirox (PENLAC) 8 % solution Apply topically at bedtime. Apply to nail/surrounding skin and daily over previous coat. Every 7 days remove with alcohol and continue. 10/09/20  Yes Edrick Kins, DPM  fluticasone (FLONASE) 50 MCG/ACT nasal spray Place 1 spray into both nostrils daily.   Yes [provider]  ibuprofen (ADVIL,MOTRIN) 200 MG tablet Take 200 mg by mouth every 6 (six) hours as needed.   Yes [provider]  pramoxine-hydrocortisone (PROCTOCREAM-HC) 1-1 % rectal cream Place 1 application rectally 2 (two) times daily. Until sxs are resolved 10/31/16  Yes Shawnee Knapp, MD  terbinafine (LAMISIL) 250 MG tablet Take 1 tablet (250 mg total) by mouth daily. 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   Trisomy 21    Hx of gross hematuria     Past Medical History:  Diagnosis Date   Allergy    Cancer ([place]) 12/02/13   seminoma   Chicken pox    Down syndrome    Trisomy 21   Family history of anesthesia complication    mother has PONV   Gall stones    Gallstones    Hematuria    History of glomerulonephritis nephrologist-  dr  Justin Mend   2002 resolved without kidney damage   S/P radiation therapy 07/22/2014 through 08/14/2014                                                      Right hockey stick (periaortic/right iliac lymph nodes) 2550 cGy in 15 sessions, para aortic lymph node boost 60 cGy in 3 sessions                         Trisomy 21     Past Surgical History:  Procedure Laterality Date   ORCHIECTOMY Right 12/02/2013   Procedure: INGUINAL ORCHIECTOMY;  Surgeon: Jorja Loa, MD;  Location: [facility];  Service: Urology;  Laterality: Right;   ROOT CANAL      Social History   Socioeconomic History   Marital status: Single    Spouse name: Not on file   Number of children: Not on file   Years of education: 12   Highest education level: Not on file  Occupational History   Occupation: janitor  Tobacco Use   Smoking status: Never   Smokeless tobacco: Never  Substance and  Sexual Activity   Alcohol use: Yes    Comment: 1 beer on occas   Drug use: No   Sexual activity: Never  Other Topics Concern   Not on file  Social History Narrative   Exercises regularly.   Social Determinants of Health   Financial Resource Strain: Not on file  Food Insecurity: Not on file  Transportation Needs: Not on file  Physical Activity: Not on file  Stress: Not on file  Social Connections: Not on file  Intimate Partner Violence: Not on file    Family History  Problem Relation Age of Onset   Hypertension Mother    Hyperlipidemia Mother    Hypertension Maternal Grandmother    Emphysema Maternal Grandmother    Hypertension Maternal Grandfather    Alcohol abuse Maternal Grandfather    Cancer Paternal Grandmother    Congestive Heart Failure Paternal Grandfather      Review of Systems  Constitutional: Negative.  Negative for fever.  HENT: Negative.  Negative for congestion and sore throat.   Eyes: Negative.   Respiratory: Negative.  Negative for cough and shortness of breath.    Cardiovascular: Negative.  Negative for chest pain and palpitations.  Gastrointestinal: Negative.  Negative for abdominal pain, nausea and vomiting.  Genitourinary: Negative.  Negative for dysuria and hematuria.  Skin: Negative.  Negative for rash.  Neurological: Negative.  Negative for dizziness and headaches.  All other systems reviewed and are negative.  Today's Vitals   04/25/22 0824  BP: 120/76  Pulse: 62  Temp: 98.2 F (36.8 C)  TempSrc: Oral  SpO2: 98%  Weight: 160 lb (72.6 kg)  Height: 5' (1.524 m)   Body mass index is 31.25 kg/m.   Physical Exam Vitals reviewed.  Constitutional:      Appearance: Normal appearance.  HENT:     Head: Normocephalic.     Right Ear: Tympanic membrane, ear canal and external ear normal.     Left Ear: Tympanic membrane, ear canal and external ear normal.     Mouth/Throat:     Mouth: Mucous membranes are moist.     Pharynx: Oropharynx is clear.  Eyes:     Extraocular Movements: Extraocular movements intact.     Conjunctiva/sclera: Conjunctivae normal.     Pupils: Pupils are equal, round, and reactive to light.  Cardiovascular:     Rate and Rhythm: Normal rate and regular rhythm.     Pulses: Normal pulses.     Heart sounds: Normal heart sounds.  Pulmonary:     Effort: Pulmonary effort is normal.     Breath sounds: Normal breath sounds.  Abdominal:     Palpations: Abdomen is soft.     Tenderness: There is no abdominal tenderness.  Musculoskeletal:     Cervical back: No tenderness.  Lymphadenopathy:     Cervical: No cervical adenopathy.  Skin:    General: Skin is warm and dry.  Neurological:     General: No focal deficit present.     Mental Status: He is alert and oriented to person, place, and time.  Psychiatric:        Mood and Affect: Mood normal.        Behavior: Behavior normal.      ASSESSMENT & PLAN: Problem List Items Addressed This Visit   None Visit Diagnoses     Routine general medical examination at a  health care facility    -  Primary   Relevant Orders   CBC with Differential   Comprehensive  metabolic panel   Hemoglobin A1c   Lipid panel   Screening for deficiency anemia       Relevant Orders   CBC with Differential   Screening for lipoid disorders       Relevant Orders   Lipid panel   Screening for endocrine, metabolic and immunity disorder       Relevant Orders   Comprehensive metabolic panel   Hemoglobin A1c   Measles, mumps, rubella (MMR) vaccination status unknown       Relevant Orders   Measles/Mumps/Rubella Immunity      Modifiable risk factors discussed with patient. Anticipatory guidance according to age provided. The following topics were also discussed: Social Determinants of Health Smoking.  Non-smoker Diet and nutrition.  Good eating habits Benefits of exercise.  Exercises regularly Cancer family history review Vaccinations reviewed and recommendations Cardiovascular risk assessment and need for blood work Mental health including depression and anxiety Fall and accident prevention  Patient Instructions  Health Maintenance, Male Adopting a healthy lifestyle and getting preventive care are important in promoting health and wellness. Ask your health care provider about: The right schedule for you to have regular tests and exams. Things you can do on your own to prevent diseases and keep yourself healthy. What should I know about diet, weight, and exercise? Eat a healthy diet  Eat a diet that includes plenty of vegetables, fruits, low-fat dairy products, and lean protein. Do not eat a lot of foods that are high in solid fats, added sugars, or sodium. Maintain a healthy weight Body mass index (BMI) is a measurement that can be used to identify possible weight problems. It estimates body fat based on height and weight. Your health care provider can help determine your BMI and help you achieve or maintain a healthy weight. Get regular exercise Get regular  exercise. This is one of the most important things you can do for your health. Most adults should: Exercise for at least 150 minutes each week. The exercise should increase your heart rate and make you sweat (moderate-intensity exercise). Do strengthening exercises at least twice a week. This is in addition to the moderate-intensity exercise. Spend less time sitting. Even light physical activity can be beneficial. Watch cholesterol and blood lipids Have your blood tested for lipids and cholesterol at 46 years of age, then have this test every 5 years. You may need to have your cholesterol levels checked more often if: Your lipid or cholesterol levels are high. You are older than 46 years of age. You are at high risk for heart disease. What should I know about cancer screening? Many types of cancers can be detected early and may often be prevented. Depending on your health history and family history, you may need to have cancer screening at various ages. This may include screening for: Colorectal cancer. Prostate cancer. Skin cancer. Lung cancer. What should I know about heart disease, diabetes, and high blood pressure? Blood pressure and heart disease High blood pressure causes heart disease and increases the risk of stroke. This is more likely to develop in people who have high blood pressure readings or are overweight. Talk with your health care provider about your target blood pressure readings. Have your blood pressure checked: Every 3-5 years if you are 55-61 years of age. Every year if you are 63 years old or older. If you are between the ages of 64 and 63 and are a current or former smoker, ask your health care provider if you should  have a one-time screening for abdominal aortic aneurysm (AAA). Diabetes Have regular diabetes screenings. This checks your fasting blood sugar level. Have the screening done: Once every three years after age 63 if you are at a normal weight and have a  low risk for diabetes. More often and at a younger age if you are overweight or have a high risk for diabetes. What should I know about preventing infection? Hepatitis B If you have a higher risk for hepatitis B, you should be screened for this virus. Talk with your health care provider to find out if you are at risk for hepatitis B infection. Hepatitis C Blood testing is recommended for: Everyone born from [birth year] through 1965. Anyone with known risk factors for hepatitis C. Sexually transmitted infections (STIs) You should be screened each year for STIs, including gonorrhea and chlamydia, if: You are sexually active and are younger than 46 years of age. You are older than 46 years of age and your health care provider tells you that you are at risk for this type of infection. Your sexual activity has changed since you were last screened, and you are at increased risk for chlamydia or gonorrhea. Ask your health care provider if you are at risk. Ask your health care provider about whether you are at high risk for HIV. Your health care provider may recommend a prescription medicine to help prevent HIV infection. If you choose to take medicine to prevent HIV, you should first get tested for HIV. You should then be tested every 3 months for as long as you are taking the medicine. Follow these instructions at home: Alcohol use Do not drink alcohol if your health care provider tells you not to drink. If you drink alcohol: Limit how much you have to 0-2 drinks a day. Know how much alcohol is in your drink. In the [place] one drink equals one 12 oz bottle of beer (355 mL), one 5 oz glass of wine (148 mL), or one 1 oz glass of hard liquor (44 mL). Lifestyle Do not use any products that contain nicotine or tobacco. These products include cigarettes, chewing tobacco, and vaping devices, such as e-cigarettes. If you need help quitting, ask your health care provider. Do not use street drugs. Do not share  needles. Ask your health care provider for help if you need support or information about quitting drugs. General instructions Schedule regular health, dental, and eye exams. Stay current with your vaccines. Tell your health care provider if: You often feel depressed. You have ever been abused or do not feel safe at home. Summary Adopting a healthy lifestyle and getting preventive care are important in promoting health and wellness. Follow your health care provider's instructions about healthy diet, exercising, and getting tested or screened for diseases. Follow your health care provider's instructions on monitoring your cholesterol and blood pressure. This information is not intended to replace advice given to you by your health care provider. Make sure you discuss any questions you have with your health care provider. Document Revised: 06/29/2020 Document Reviewed: 06/29/2020 Elsevier Patient Education  [place], MD [facility]

## 2022-04-25 NOTE — Patient Instructions (Signed)
Health Maintenance, Male Adopting a healthy lifestyle and getting preventive care are important in promoting health and wellness. Ask your health care provider about: The right schedule for you to have regular tests and exams. Things you can do on your own to prevent diseases and keep yourself healthy. What should I know about diet, weight, and exercise? Eat a healthy diet  Eat a diet that includes plenty of vegetables, fruits, low-fat dairy products, and lean protein. Do not eat a lot of foods that are high in solid fats, added sugars, or sodium. Maintain a healthy weight Body mass index (BMI) is a measurement that can be used to identify possible weight problems. It estimates body fat based on height and weight. Your health care provider can help determine your BMI and help you achieve or maintain a healthy weight. Get regular exercise Get regular exercise. This is one of the most important things you can do for your health. Most adults should: Exercise for at least 150 minutes each week. The exercise should increase your heart rate and make you sweat (moderate-intensity exercise). Do strengthening exercises at least twice a week. This is in addition to the moderate-intensity exercise. Spend less time sitting. Even light physical activity can be beneficial. Watch cholesterol and blood lipids Have your blood tested for lipids and cholesterol at 46 years of age, then have this test every 5 years. You may need to have your cholesterol levels checked more often if: Your lipid or cholesterol levels are high. You are older than 46 years of age. You are at high risk for heart disease. What should I know about cancer screening? Many types of cancers can be detected early and may often be prevented. Depending on your health history and family history, you may need to have cancer screening at various ages. This may include screening for: Colorectal cancer. Prostate cancer. Skin cancer. Lung  cancer. What should I know about heart disease, diabetes, and high blood pressure? Blood pressure and heart disease High blood pressure causes heart disease and increases the risk of stroke. This is more likely to develop in people who have high blood pressure readings or are overweight. Talk with your health care provider about your target blood pressure readings. Have your blood pressure checked: Every 3-5 years if you are 18-39 years of age. Every year if you are 40 years old or older. If you are between the ages of 65 and 75 and are a current or former smoker, ask your health care provider if you should have a one-time screening for abdominal aortic aneurysm (AAA). Diabetes Have regular diabetes screenings. This checks your fasting blood sugar level. Have the screening done: Once every three years after age 45 if you are at a normal weight and have a low risk for diabetes. More often and at a younger age if you are overweight or have a high risk for diabetes. What should I know about preventing infection? Hepatitis B If you have a higher risk for hepatitis B, you should be screened for this virus. Talk with your health care provider to find out if you are at risk for hepatitis B infection. Hepatitis C Blood testing is recommended for: Everyone born from 1945 through 1965. Anyone with known risk factors for hepatitis C. Sexually transmitted infections (STIs) You should be screened each year for STIs, including gonorrhea and chlamydia, if: You are sexually active and are younger than 46 years of age. You are older than 46 years of age and your   health care provider tells you that you are at risk for this type of infection. Your sexual activity has changed since you were last screened, and you are at increased risk for chlamydia or gonorrhea. Ask your health care provider if you are at risk. Ask your health care provider about whether you are at high risk for HIV. Your health care provider  may recommend a prescription medicine to help prevent HIV infection. If you choose to take medicine to prevent HIV, you should first get tested for HIV. You should then be tested every 3 months for as long as you are taking the medicine. Follow these instructions at home: Alcohol use Do not drink alcohol if your health care provider tells you not to drink. If you drink alcohol: Limit how much you have to 0-2 drinks a day. Know how much alcohol is in your drink. In the [place] one drink equals one 12 oz bottle of beer (355 mL), one 5 oz glass of wine (148 mL), or one 1 oz glass of hard liquor (44 mL). Lifestyle Do not use any products that contain nicotine or tobacco. These products include cigarettes, chewing tobacco, and vaping devices, such as e-cigarettes. If you need help quitting, ask your health care provider. Do not use street drugs. Do not share needles. Ask your health care provider for help if you need support or information about quitting drugs. General instructions Schedule regular health, dental, and eye exams. Stay current with your vaccines. Tell your health care provider if: You often feel depressed. You have ever been abused or do not feel safe at home. Summary Adopting a healthy lifestyle and getting preventive care are important in promoting health and wellness. Follow your health care provider's instructions about healthy diet, exercising, and getting tested or screened for diseases. Follow your health care provider's instructions on monitoring your cholesterol and blood pressure. This information is not intended to replace advice given to you by your health care provider. Make sure you discuss any questions you have with your health care provider. Document Revised: 06/29/2020 Document Reviewed: 06/29/2020 Elsevier Patient Education  2023 [employer] Inc[employer]  

## 2022-04-26 LAB — MEASLES/MUMPS/RUBELLA IMMUNITY
Mumps IgG: <9 AU/mL — ABNORMAL LOW
Rubella: <0.9 Index — ABNORMAL LOW
Rubeola IgG: 167 AU/mL

## 2022-05-02 ENCOUNTER — Ambulatory Visit (INDEPENDENT_AMBULATORY_CARE_PROVIDER_SITE_OTHER): Payer: Medicare Other | Admitting: *Deleted

## 2022-05-02 DIAGNOSIS — Z23 Encounter for immunization: Secondary | ICD-10-CM

## 2022-05-02 DIAGNOSIS — Z789 Other specified health status: Secondary | ICD-10-CM

## 2022-05-02 NOTE — Progress Notes (Signed)
Pls cosign for MMR inj../lmb

## 2022-09-08 DIAGNOSIS — H0102A Squamous blepharitis right eye, upper and lower eyelids: Secondary | ICD-10-CM | POA: Diagnosis not present

## 2022-09-08 DIAGNOSIS — H2513 Age-related nuclear cataract, bilateral: Secondary | ICD-10-CM | POA: Diagnosis not present

## 2022-09-08 DIAGNOSIS — H10413 Chronic giant papillary conjunctivitis, bilateral: Secondary | ICD-10-CM | POA: Diagnosis not present

## 2022-09-08 DIAGNOSIS — H0102B Squamous blepharitis left eye, upper and lower eyelids: Secondary | ICD-10-CM | POA: Diagnosis not present

## 2022-09-20 DIAGNOSIS — H10413 Chronic giant papillary conjunctivitis, bilateral: Secondary | ICD-10-CM | POA: Diagnosis not present

## 2022-09-20 DIAGNOSIS — H0102A Squamous blepharitis right eye, upper and lower eyelids: Secondary | ICD-10-CM | POA: Diagnosis not present

## 2022-09-20 DIAGNOSIS — H0102B Squamous blepharitis left eye, upper and lower eyelids: Secondary | ICD-10-CM | POA: Diagnosis not present

## 2022-09-20 DIAGNOSIS — H2513 Age-related nuclear cataract, bilateral: Secondary | ICD-10-CM | POA: Diagnosis not present

## 2022-12-14 ENCOUNTER — Ambulatory Visit (INDEPENDENT_AMBULATORY_CARE_PROVIDER_SITE_OTHER): Payer: Medicare Other | Admitting: Emergency Medicine

## 2022-12-14 ENCOUNTER — Encounter: Payer: Self-pay | Admitting: Emergency Medicine

## 2022-12-14 ENCOUNTER — Ambulatory Visit (INDEPENDENT_AMBULATORY_CARE_PROVIDER_SITE_OTHER): Payer: Medicare Other

## 2022-12-14 VITALS — BP 118/74 | HR 83 | Temp 98.4°F | Ht 60.0 in | Wt 151.2 lb

## 2022-12-14 DIAGNOSIS — R079 Chest pain, unspecified: Secondary | ICD-10-CM

## 2022-12-14 DIAGNOSIS — C6291 Malignant neoplasm of right testis, unspecified whether descended or undescended: Secondary | ICD-10-CM | POA: Diagnosis not present

## 2022-12-14 DIAGNOSIS — Q909 Down syndrome, unspecified: Secondary | ICD-10-CM

## 2022-12-14 DIAGNOSIS — F418 Other specified anxiety disorders: Secondary | ICD-10-CM | POA: Insufficient documentation

## 2022-12-14 LAB — LIPID PANEL
Cholesterol: 172 mg/dL (ref 0–200)
HDL: 39.8 mg/dL (ref >39.00)
LDL Cholesterol: 99 mg/dL (ref 0–99)
NonHDL: 131.76
Total CHOL/HDL Ratio: 4
Triglycerides: 162 mg/dL — ABNORMAL HIGH (ref 0.0–149.0)
VLDL: 32.4 mg/dL (ref 0.0–40.0)

## 2022-12-14 LAB — CBC WITH DIFFERENTIAL/PLATELET
Basophils Absolute: 0.1 10*3/uL (ref 0.0–0.1)
Basophils Relative: 1 % (ref 0.0–3.0)
Eosinophils Absolute: 0 10*3/uL (ref 0.0–0.7)
Eosinophils Relative: 0.5 % (ref 0.0–5.0)
HCT: 44 % (ref 39.0–52.0)
Hemoglobin: 14.4 g/dL (ref 13.0–17.0)
Lymphocytes Relative: 22 % (ref 12.0–46.0)
Lymphs Abs: 1.4 10*3/uL (ref 0.7–4.0)
MCHC: 32.8 g/dL (ref 30.0–36.0)
MCV: 93.9 fL (ref 78.0–100.0)
Monocytes Absolute: 0.6 10*3/uL (ref 0.1–1.0)
Monocytes Relative: 10.1 % (ref 3.0–12.0)
Neutro Abs: 4.1 10*3/uL (ref 1.4–7.7)
Neutrophils Relative %: 66.4 % (ref 43.0–77.0)
Platelets: 252 10*3/uL (ref 150.0–400.0)
RBC: 4.69 Mil/uL (ref 4.22–5.81)
RDW: 14 % (ref 11.5–15.5)
WBC: 6.2 10*3/uL (ref 4.0–10.5)

## 2022-12-14 LAB — COMPREHENSIVE METABOLIC PANEL
ALT: 12 U/L (ref 0–53)
AST: 14 U/L (ref 0–37)
Albumin: 3.9 g/dL (ref 3.5–5.2)
Alkaline Phosphatase: 69 U/L (ref 39–117)
BUN: 11 mg/dL (ref 6–23)
CO2: 32 meq/L (ref 19–32)
Calcium: 9.3 mg/dL (ref 8.4–10.5)
Chloride: 102 meq/L (ref 96–112)
Creatinine, Ser: 0.92 mg/dL (ref 0.40–1.50)
GFR: 99.66 mL/min (ref >60.00)
Glucose, Bld: 109 mg/dL — ABNORMAL HIGH (ref 70–99)
Potassium: 4.5 meq/L (ref 3.5–5.1)
Sodium: 139 meq/L (ref 135–145)
Total Bilirubin: 0.5 mg/dL (ref 0.2–1.2)
Total Protein: 7 g/dL (ref 6.0–8.3)

## 2022-12-14 LAB — SEDIMENTATION RATE: Sed Rate: 17 mm/h — ABNORMAL HIGH (ref 0–15)

## 2022-12-14 LAB — HEMOGLOBIN A1C: Hgb A1c MFr Bld: 5.3 % (ref 4.6–6.5)

## 2022-12-14 MED ORDER — HYDROXYZINE HCL 25 MG PO TABS: 25.0000 mg | ORAL_TABLET | Freq: Every evening | ORAL | 1 refills | Status: DC | PRN

## 2022-12-14 NOTE — Assessment & Plan Note (Signed)
Clinically stable.  No red flag signs or symptoms. No cardiac history. Benign exam. Normal EKG. Unremarkable blood results Chest x-ray done today.  Report reviewed. Differential diagnosis discussed.  Doubt cardiac etiology. Most likely stress related and anxiety induced.

## 2022-12-14 NOTE — Assessment & Plan Note (Signed)
In remission Mother requesting blood work for follow-up

## 2022-12-14 NOTE — Patient Instructions (Signed)
Nonspecific Chest Pain Chest pain can be caused by many different conditions. Some causes of chest pain can be life-threatening. These will require treatment right away. Serious causes of chest pain include: Heart attack. A tear in the body's main blood vessel. Redness and swelling (inflammation) around your heart. Blood clot in your lungs. Other causes of chest pain may not be so serious. These include: Heartburn. Anxiety or stress. Damage to bones or muscles in your chest. Lung infections. Chest pain can feel like: Pain or discomfort in your chest. Crushing, pressure, aching, or squeezing pain. Burning or tingling. Dull or sharp pain that is worse when you move, cough, or take a deep breath. Pain or discomfort that is also felt in your back, neck, jaw, shoulder, or arm, or pain that spreads to any of these areas. It is hard to know whether your pain is caused by something that is serious or something that is not so serious. So it is important to see your doctor right away if you have chest pain. Follow these instructions at home: Medicines Take over-the-counter and prescription medicines only as told by your doctor. If you were prescribed an antibiotic medicine, take it as told by your doctor. Do not stop taking the antibiotic even if you start to feel better. Lifestyle  Rest as told by your doctor. Do not use any products that contain nicotine or tobacco, such as cigarettes, e-cigarettes, and chewing tobacco. If you need help quitting, ask your doctor. Do not drink alcohol. Make lifestyle changes as told by your doctor. These may include: Getting regular exercise. Ask your doctor what activities are safe for you. Eating a heart-healthy diet. A diet and nutrition specialist (dietitian) can help you to learn healthy eating options. Staying at a healthy weight. Treating diabetes or high blood pressure, if needed. Lowering your stress. Activities such as yoga and relaxation techniques  can help. General instructions Pay attention to any changes in your symptoms. Tell your doctor about them or any new symptoms. Avoid any activities that cause chest pain. Keep all follow-up visits as told by your doctor. This is important. You may need more testing if your chest pain does not go away. Contact a doctor if: Your chest pain does not go away. You feel depressed. You have a fever. Get help right away if: Your chest pain is worse. You have a cough that gets worse, or you cough up blood. You have very bad (severe) pain in your belly (abdomen). You pass out (faint). You have either of these for no clear reason: Sudden chest discomfort. Sudden discomfort in your arms, back, neck, or jaw. You have shortness of breath at any time. You suddenly start to sweat, or your skin gets clammy. You feel sick to your stomach (nauseous). You throw up (vomit). You suddenly feel lightheaded or dizzy. You feel very weak or tired. Your heart starts to beat fast, or it feels like it is skipping beats. These symptoms may be an emergency. Do not wait to see if the symptoms will go away. Get medical help right away. Call your local emergency services (911 in the [place] Do not drive yourself to the hospital. Summary Chest pain can be caused by many different conditions. The cause may be serious and need treatment right away. If you have chest pain, see your doctor right away. Follow your doctor's instructions for taking medicines and making lifestyle changes. Keep all follow-up visits as told by your doctor. This includes visits for any further   testing if your chest pain does not go away. Be sure to know the signs that show that your condition has become worse. Get help right away if you have these symptoms. This information is not intended to replace advice given to you by your health care provider. Make sure you discuss any questions you have with your health care provider. Document Revised:  12/23/2021 Document Reviewed: 12/23/2021 Elsevier Patient Education  2024 [employer] Inc[employer]  

## 2022-12-14 NOTE — Progress Notes (Signed)
Zachary Wheeler 47 y.o.   Chief Complaint  Patient presents with   Chest Pain    Patient states he is having some sharp chest pains at night off and on . This issue has been going on for about a month     HISTORY OF PRESENT ILLNESS: This is a 47 y.o. male complaining of intermittent episodes of sharp chest pain that wake him up associated with period of anxiety No other associated symptoms.  No cardiac history. No other complaints or medical concerns today.  HPI   Prior to Admission medications   Medication Sig Start Date End Date Taking? Authorizing Provider  ciclopirox (PENLAC) 8 % solution Apply topically at bedtime. Apply to nail/surrounding skin and daily over previous coat. Every 7 days remove with alcohol and continue. 10/09/20  Yes Felecia Shelling, DPM  fluticasone (FLONASE) 50 MCG/ACT nasal spray Place 1 spray into both nostrils daily.   Yes [provider]  ibuprofen (ADVIL,MOTRIN) 200 MG tablet Take 200 mg by mouth every 6 (six) hours as needed.   Yes [provider]  pramoxine-hydrocortisone (PROCTOCREAM-HC) 1-1 % rectal cream Place 1 application rectally 2 (two) times daily. Until sxs are resolved 10/31/16  Yes Sherren Mocha, MD  terbinafine (LAMISIL) 250 MG tablet Take 1 tablet (250 mg total) by mouth daily. 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   Trisomy 21    Hx of gross hematuria     Past Medical History:  Diagnosis Date   Allergy    Cancer (HCC) 12/02/13   seminoma   Chicken pox    Down syndrome    Trisomy 21   Family history of anesthesia complication    mother has PONV   Gall stones    Gallstones    Hematuria    History of glomerulonephritis nephrologist-  dr Hyman Hopes   2002 resolved without  kidney damage   S/P radiation therapy 07/22/2014 through 08/14/2014                                                      Right hockey stick (periaortic/right iliac lymph nodes) 2550 cGy in 15 sessions, para aortic lymph node boost 60 cGy in 3 sessions                         Trisomy 21     Past Surgical History:  Procedure Laterality Date   ORCHIECTOMY Right 12/02/2013   Procedure: INGUINAL ORCHIECTOMY;  Surgeon: Chelsea Aus, MD;  Location: [facility];  Service: Urology;  Laterality: Right;   ROOT CANAL      Social History   Socioeconomic History   Marital status: Single    Spouse name: Not on file   Number of children: Not on file   Years of education: 12   Highest education level: Not on file  Occupational History   Occupation: janitor  Tobacco Use   Smoking status: Never   Smokeless tobacco: Never  Substance and Sexual Activity  Alcohol use: Yes    Comment: 1 beer on occas   Drug use: No   Sexual activity: Never  Other Topics Concern   Not on file  Social History Narrative   Exercises regularly.   Social Determinants of Health   Financial Resource Strain: Not on file  Food Insecurity: Not on file  Transportation Needs: Not on file  Physical Activity: Not on file  Stress: Not on file  Social Connections: Not on file  Intimate Partner Violence: Not on file    Family History  Problem Relation Age of Onset   Hypertension Mother    Hyperlipidemia Mother    Hypertension Maternal Grandmother    Emphysema Maternal Grandmother    Hypertension Maternal Grandfather    Alcohol abuse Maternal Grandfather    Cancer Paternal Grandmother    Congestive Heart Failure Paternal Grandfather      Review of Systems  Constitutional: Negative.  Negative for chills and fever.  HENT: Negative.  Negative for congestion and sore throat.   Respiratory: Negative.  Negative for cough and shortness of breath.   Cardiovascular:  Positive for chest pain.   Gastrointestinal:  Negative for abdominal pain, diarrhea, nausea and vomiting.  Genitourinary: Negative.  Negative for dysuria and hematuria.  Skin: Negative.  Negative for rash.  Neurological: Negative.  Negative for dizziness and headaches.  All other systems reviewed and are negative.   Vitals:   12/14/22 1339  BP: 118/74  Pulse: 83  Temp: 98.4 F (36.9 C)  SpO2: 98%    Physical Exam Vitals reviewed.  Constitutional:      Appearance: Normal appearance.  HENT:     Head: Normocephalic.     Mouth/Throat:     Mouth: Mucous membranes are moist.     Pharynx: Oropharynx is clear.  Eyes:     Extraocular Movements: Extraocular movements intact.     Pupils: Pupils are equal, round, and reactive to light.  Cardiovascular:     Rate and Rhythm: Normal rate and regular rhythm.     Pulses: Normal pulses.     Heart sounds: Normal heart sounds.  Pulmonary:     Effort: Pulmonary effort is normal.     Breath sounds: Normal breath sounds.  Abdominal:     Palpations: Abdomen is soft.     Tenderness: There is no abdominal tenderness.  Musculoskeletal:     Cervical back: No tenderness.  Lymphadenopathy:     Cervical: No cervical adenopathy.  Skin:    General: Skin is warm and dry.     Capillary Refill: Capillary refill takes less than 2 seconds.  Neurological:     General: No focal deficit present.     Mental Status: He is alert and oriented to person, place, and time.  Psychiatric:        Mood and Affect: Mood normal.        Behavior: Behavior normal.    EKG: Normal sinus rhythm with ventricular rate of 63/min.  No acute ischemic changes.  Normal EKG.  ASSESSMENT & PLAN: A total of 43 minutes was spent with the patient and counseling/coordination of care regarding preparing for this visit, review of most recent office visit notes, review of chronic medical conditions under management, differential diagnosis of chest pain, review of most recent blood work results, review of  chest x-ray report done today, prognosis, ED precautions, documentation, and need for follow-up.  Problem List Items Addressed This Visit       Genitourinary   Seminoma of  right testis (HCC)    In remission Mother requesting blood work for follow-up      Relevant Orders   AFP tumor marker   Lactate dehydrogenase     Other   Situational anxiety    Known triggers. Stress management discussed Contributing to chest pain episodes      Relevant Medications   hydrOXYzine (ATARAX) 25 MG tablet   Nonspecific chest pain - Primary    Clinically stable.  No red flag signs or symptoms. No cardiac history. Benign exam. Normal EKG. Unremarkable blood results Chest x-ray done today.  Report reviewed. Differential diagnosis discussed.  Doubt cardiac etiology. Most likely stress related and anxiety induced.      Relevant Orders   EKG 12-Lead   Comprehensive metabolic panel (Completed)   CBC with Differential/Platelet (Completed)   Hemoglobin A1c (Completed)   Lipid panel (Completed)   Sedimentation rate (Completed)   DG Chest 2 View   Other Visit Diagnoses     Down syndrome          Patient Instructions  Nonspecific Chest Pain Chest pain can be caused by many different conditions. Some causes of chest pain can be life-threatening. These will require treatment right away. Serious causes of chest pain include: Heart attack. A tear in the body's main blood vessel. Redness and swelling (inflammation) around your heart. Blood clot in your lungs. Other causes of chest pain may not be so serious. These include: Heartburn. Anxiety or stress. Damage to bones or muscles in your chest. Lung infections. Chest pain can feel like: Pain or discomfort in your chest. Crushing, pressure, aching, or squeezing pain. Burning or tingling. Dull or sharp pain that is worse when you move, cough, or take a deep breath. Pain or discomfort that is also felt in your back, neck, jaw, shoulder, or  arm, or pain that spreads to any of these areas. It is hard to know whether your pain is caused by something that is serious or something that is not so serious. So it is important to see your doctor right away if you have chest pain. Follow these instructions at home: Medicines Take over-the-counter and prescription medicines only as told by your doctor. If you were prescribed an antibiotic medicine, take it as told by your doctor. Do not stop taking the antibiotic even if you start to feel better. Lifestyle  Rest as told by your doctor. Do not use any products that contain nicotine or tobacco, such as cigarettes, e-cigarettes, and chewing tobacco. If you need help quitting, ask your doctor. Do not drink alcohol. Make lifestyle changes as told by your doctor. These may include: Getting regular exercise. Ask your doctor what activities are safe for you. Eating a heart-healthy diet. A diet and nutrition specialist (dietitian) can help you to learn healthy eating options. Staying at a healthy weight. Treating diabetes or high blood pressure, if needed. Lowering your stress. Activities such as yoga and relaxation techniques can help. General instructions Pay attention to any changes in your symptoms. Tell your doctor about them or any new symptoms. Avoid any activities that cause chest pain. Keep all follow-up visits as told by your doctor. This is important. You may need more testing if your chest pain does not go away. Contact a doctor if: Your chest pain does not go away. You feel depressed. You have a fever. Get help right away if: Your chest pain is worse. You have a cough that gets worse, or you cough up blood. You  have very bad (severe) pain in your belly (abdomen). You pass out (faint). You have either of these for no clear reason: Sudden chest discomfort. Sudden discomfort in your arms, back, neck, or jaw. You have shortness of breath at any time. You suddenly start to sweat,  or your skin gets clammy. You feel sick to your stomach (nauseous). You throw up (vomit). You suddenly feel lightheaded or dizzy. You feel very weak or tired. Your heart starts to beat fast, or it feels like it is skipping beats. These symptoms may be an emergency. Do not wait to see if the symptoms will go away. Get medical help right away. Call your local emergency services (911 in the [place] Do not drive yourself to the hospital. Summary Chest pain can be caused by many different conditions. The cause may be serious and need treatment right away. If you have chest pain, see your doctor right away. Follow your doctor's instructions for taking medicines and making lifestyle changes. Keep all follow-up visits as told by your doctor. This includes visits for any further testing if your chest pain does not go away. Be sure to know the signs that show that your condition has become worse. Get help right away if you have these symptoms. This information is not intended to replace advice given to you by your health care provider. Make sure you discuss any questions you have with your health care provider. Document Revised: 12/23/2021 Document Reviewed: 12/23/2021 Elsevier Patient Education  2024 [employer].     Edwina Barth, MD [facility]

## 2022-12-14 NOTE — Assessment & Plan Note (Signed)
Known triggers. Stress management discussed Contributing to chest pain episodes

## 2022-12-15 LAB — LACTATE DEHYDROGENASE: LDH: 112 U/L (ref 100–220)

## 2022-12-15 LAB — AFP TUMOR MARKER: AFP-Tumor Marker: 3.6 ng/mL (ref <6.1)

## 2023-03-07 DIAGNOSIS — K080 Exfoliation of teeth due to systemic causes: Secondary | ICD-10-CM | POA: Diagnosis not present

## 2023-04-26 ENCOUNTER — Ambulatory Visit: Payer: Medicare Other | Admitting: Emergency Medicine

## 2023-04-26 ENCOUNTER — Encounter: Payer: Self-pay | Admitting: Emergency Medicine

## 2023-04-26 VITALS — BP 120/78 | HR 84 | Temp 98.3°F | Ht 60.0 in | Wt 139.0 lb

## 2023-04-26 DIAGNOSIS — Z13220 Encounter for screening for lipoid disorders: Secondary | ICD-10-CM

## 2023-04-26 DIAGNOSIS — F418 Other specified anxiety disorders: Secondary | ICD-10-CM

## 2023-04-26 DIAGNOSIS — Z0000 Encounter for general adult medical examination without abnormal findings: Secondary | ICD-10-CM | POA: Diagnosis not present

## 2023-04-26 DIAGNOSIS — Z1329 Encounter for screening for other suspected endocrine disorder: Secondary | ICD-10-CM

## 2023-04-26 DIAGNOSIS — Z130 Encounter for screening for diseases of the blood and blood-forming organs and certain disorders involving the immune mechanism: Secondary | ICD-10-CM | POA: Diagnosis not present

## 2023-04-26 DIAGNOSIS — Z125 Encounter for screening for malignant neoplasm of prostate: Secondary | ICD-10-CM | POA: Diagnosis not present

## 2023-04-26 DIAGNOSIS — Z1211 Encounter for screening for malignant neoplasm of colon: Secondary | ICD-10-CM | POA: Diagnosis not present

## 2023-04-26 DIAGNOSIS — Z13228 Encounter for screening for other metabolic disorders: Secondary | ICD-10-CM | POA: Diagnosis not present

## 2023-04-26 LAB — CBC WITH DIFFERENTIAL/PLATELET
Basophils Absolute: 0.1 10*3/uL (ref 0.0–0.1)
Basophils Relative: 1 % (ref 0.0–3.0)
Eosinophils Absolute: 0 10*3/uL (ref 0.0–0.7)
Eosinophils Relative: 0.2 % (ref 0.0–5.0)
HCT: 46.6 % (ref 39.0–52.0)
Hemoglobin: 15.3 g/dL (ref 13.0–17.0)
Lymphocytes Relative: 15.7 % (ref 12.0–46.0)
Lymphs Abs: 1.5 10*3/uL (ref 0.7–4.0)
MCHC: 32.8 g/dL (ref 30.0–36.0)
MCV: 94.8 fl (ref 78.0–100.0)
Monocytes Absolute: 0.7 10*3/uL (ref 0.1–1.0)
Monocytes Relative: 6.9 % (ref 3.0–12.0)
Neutro Abs: 7.3 10*3/uL (ref 1.4–7.7)
Neutrophils Relative %: 76.2 % (ref 43.0–77.0)
Platelets: 256 10*3/uL (ref 150.0–400.0)
RBC: 4.91 Mil/uL (ref 4.22–5.81)
RDW: 14.3 % (ref 11.5–15.5)
WBC: 9.5 10*3/uL (ref 4.0–10.5)

## 2023-04-26 LAB — COMPREHENSIVE METABOLIC PANEL
ALT: 14 U/L (ref 0–53)
AST: 18 U/L (ref 0–37)
Albumin: 4 g/dL (ref 3.5–5.2)
Alkaline Phosphatase: 76 U/L (ref 39–117)
BUN: 12 mg/dL (ref 6–23)
CO2: 29 meq/L (ref 19–32)
Calcium: 9.3 mg/dL (ref 8.4–10.5)
Chloride: 105 meq/L (ref 96–112)
Creatinine, Ser: 0.93 mg/dL (ref 0.40–1.50)
GFR: 98.12 mL/min (ref >60.00)
Glucose, Bld: 114 mg/dL — ABNORMAL HIGH (ref 70–99)
Potassium: 4.9 meq/L (ref 3.5–5.1)
Sodium: 139 meq/L (ref 135–145)
Total Bilirubin: 0.6 mg/dL (ref 0.2–1.2)
Total Protein: 7 g/dL (ref 6.0–8.3)

## 2023-04-26 LAB — PSA: PSA: 0.14 ng/mL (ref 0.10–4.00)

## 2023-04-26 LAB — LIPID PANEL
Cholesterol: 180 mg/dL (ref 0–200)
HDL: 44.6 mg/dL (ref >39.00)
LDL Cholesterol: 117 mg/dL — ABNORMAL HIGH (ref 0–99)
NonHDL: 134.93
Total CHOL/HDL Ratio: 4
Triglycerides: 90 mg/dL (ref 0.0–149.0)
VLDL: 18 mg/dL (ref 0.0–40.0)

## 2023-04-26 LAB — TSH: TSH: 2.65 u[IU]/mL (ref 0.35–5.50)

## 2023-04-26 LAB — HEMOGLOBIN A1C: Hgb A1c MFr Bld: 5.6 % (ref 4.6–6.5)

## 2023-04-26 LAB — VITAMIN B12: Vitamin B-12: 224 pg/mL (ref 211–911)

## 2023-04-26 LAB — VITAMIN D 25 HYDROXY (VIT D DEFICIENCY, FRACTURES): VITD: 17.75 ng/mL — ABNORMAL LOW (ref 30.00–100.00)

## 2023-04-26 MED ORDER — HYDROXYZINE HCL 25 MG PO TABS: 25.0000 mg | ORAL_TABLET | Freq: Every evening | ORAL | 1 refills | Status: AC | PRN

## 2023-04-26 NOTE — Patient Instructions (Signed)
 Health Maintenance, Male  Adopting a healthy lifestyle and getting preventive care are important in promoting health and wellness. Ask your health care provider about:  The right schedule for you to have regular tests and exams.  Things you can do on your own to prevent diseases and keep yourself healthy.  What should I know about diet, weight, and exercise?  Eat a healthy diet    Eat a diet that includes plenty of vegetables, fruits, low-fat dairy products, and lean protein.  Do not eat a lot of foods that are high in solid fats, added sugars, or sodium.  Maintain a healthy weight  Body mass index (BMI) is a measurement that can be used to identify possible weight problems. It estimates body fat based on height and weight. Your health care provider can help determine your BMI and help you achieve or maintain a healthy weight.  Get regular exercise  Get regular exercise. This is one of the most important things you can do for your health. Most adults should:  Exercise for at least 150 minutes each week. The exercise should increase your heart rate and make you sweat (moderate-intensity exercise).  Do strengthening exercises at least twice a week. This is in addition to the moderate-intensity exercise.  Spend less time sitting. Even light physical activity can be beneficial.  Watch cholesterol and blood lipids  Have your blood tested for lipids and cholesterol at 47 years of age, then have this test every 5 years.  You may need to have your cholesterol levels checked more often if:  Your lipid or cholesterol levels are high.  You are older than 47 years of age.  You are at high risk for heart disease.  What should I know about cancer screening?  Many types of cancers can be detected early and may often be prevented. Depending on your health history and family history, you may need to have cancer screening at various ages. This may include screening for:  Colorectal cancer.  Prostate cancer.  Skin cancer.  Lung  cancer.  What should I know about heart disease, diabetes, and high blood pressure?  Blood pressure and heart disease  High blood pressure causes heart disease and increases the risk of stroke. This is more likely to develop in people who have high blood pressure readings or are overweight.  Talk with your health care provider about your target blood pressure readings.  Have your blood pressure checked:  Every 3-5 years if you are 9-95 years of age.  Every year if you are 85 years old or older.  If you are between the ages of 29 and 29 and are a current or former smoker, ask your health care provider if you should have a one-time screening for abdominal aortic aneurysm (AAA).  Diabetes  Have regular diabetes screenings. This checks your fasting blood sugar level. Have the screening done:  Once every three years after age 23 if you are at a normal weight and have a low risk for diabetes.  More often and at a younger age if you are overweight or have a high risk for diabetes.  What should I know about preventing infection?  Hepatitis B  If you have a higher risk for hepatitis B, you should be screened for this virus. Talk with your health care provider to find out if you are at risk for hepatitis B infection.  Hepatitis C  Blood testing is recommended for:  Everyone born from [birth year] through 1964.  Anyone  with known risk factors for hepatitis C.  Sexually transmitted infections (STIs)  You should be screened each year for STIs, including gonorrhea and chlamydia, if:  You are sexually active and are younger than 47 years of age.  You are older than 47 years of age and your health care provider tells you that you are at risk for this type of infection.  Your sexual activity has changed since you were last screened, and you are at increased risk for chlamydia or gonorrhea. Ask your health care provider if you are at risk.  Ask your health care provider about whether you are at high risk for HIV. Your health care provider  may recommend a prescription medicine to help prevent HIV infection. If you choose to take medicine to prevent HIV, you should first get tested for HIV. You should then be tested every 3 months for as long as you are taking the medicine.  Follow these instructions at home:  Alcohol use  Do not drink alcohol if your health care provider tells you not to drink.  If you drink alcohol:  Limit how much you have to 0-2 drinks a day.  Know how much alcohol is in your drink. In the [place] one drink equals one 12 oz bottle of beer (355 mL), one 5 oz glass of wine (148 mL), or one 1 oz glass of hard liquor (44 mL).  Lifestyle  Do not use any products that contain nicotine or tobacco. These products include cigarettes, chewing tobacco, and vaping devices, such as e-cigarettes. If you need help quitting, ask your health care provider.  Do not use street drugs.  Do not share needles.  Ask your health care provider for help if you need support or information about quitting drugs.  General instructions  Schedule regular health, dental, and eye exams.  Stay current with your vaccines.  Tell your health care provider if:  You often feel depressed.  You have ever been abused or do not feel safe at home.  Summary  Adopting a healthy lifestyle and getting preventive care are important in promoting health and wellness.  Follow your health care provider's instructions about healthy diet, exercising, and getting tested or screened for diseases.  Follow your health care provider's instructions on monitoring your cholesterol and blood pressure.  This information is not intended to replace advice given to you by your health care provider. Make sure you discuss any questions you have with your health care provider.  Document Revised: 06/29/2020 Document Reviewed: 06/29/2020  Elsevier Patient Education  2024 [employer].

## 2023-04-26 NOTE — Progress Notes (Signed)
 Zachary Wheeler 48 y.o.   Chief Complaint  Patient presents with   Annual Exam    Patient is here for physical, pt wanting to know if he can have a refill for Hydroxyzine for his anxiety. Pt wants to inquire about a hearing test. Questions about cancer. Pt mentions 3 weeks ago he did have food poisoning and is feeling better but has a lot of gas and burping. Patient mentions since October his appetite has decreased     HISTORY OF PRESENT ILLNESS: This is a 48 y.o. male here for annual exam.  Accompanied by mother. Has issues with intermittent anxiety. No other complaints or medical concerns today.  HPI   Prior to Admission medications   Medication Sig Start Date End Date Taking? Authorizing Provider  fluticasone (FLONASE) 50 MCG/ACT nasal spray Place 1 spray into both nostrils daily.   Yes [provider]  hydrOXYzine (ATARAX) 25 MG tablet Take 1 tablet (25 mg total) by mouth at bedtime as needed. 12/14/22  Yes Kyuss Hale, Eilleen Kempf, MD  ibuprofen (ADVIL,MOTRIN) 200 MG tablet Take 200 mg by mouth every 6 (six) hours as needed.   Yes [provider]  ciclopirox (PENLAC) 8 % solution Apply topically at bedtime. Apply to nail/surrounding skin and daily over previous coat. Every 7 days remove with alcohol and continue. Patient not taking: Reported on 04/26/2023 10/09/20   Felecia Shelling, DPM  pramoxine-hydrocortisone (PROCTOCREAM-HC) 1-1 % rectal cream Place 1 application rectally 2 (two) times daily. Until sxs are resolved Patient not taking: Reported on 04/26/2023 10/31/16   Sherren Mocha, MD  terbinafine (LAMISIL) 250 MG tablet Take 1 tablet (250 mg total) by mouth daily. 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   Trisomy 21    Hx of gross hematuria     Past Medical History:  Diagnosis Date   Allergy    Cancer (HCC) 12/02/13   seminoma   Chicken pox    Down syndrome    Trisomy 21   Family history of anesthesia complication    mother has PONV   Gall stones    Gallstones    Hematuria    History of glomerulonephritis nephrologist-  dr Hyman Hopes   2002 resolved without kidney damage   S/P radiation therapy 07/22/2014 through 08/14/2014                                                      Right hockey stick (periaortic/right iliac lymph nodes) 2550 cGy in 15 sessions, para aortic lymph node boost 60 cGy in 3 sessions                         Trisomy 21     Past Surgical History:  Procedure Laterality Date   ORCHIECTOMY Right 12/02/2013   Procedure: INGUINAL ORCHIECTOMY;  Surgeon: Chelsea Aus, MD;  Location: [facility];  Service: Urology;  Laterality: Right;   ROOT CANAL  Social History   Socioeconomic History   Marital status: Single    Spouse name: Not on file   Number of children: Not on file   Years of education: 12   Highest education level: Not on file  Occupational History   Occupation: janitor  Tobacco Use   Smoking status: Never   Smokeless tobacco: Never  Substance and Sexual Activity   Alcohol use: Yes    Comment: 1 beer on occas   Drug use: No   Sexual activity: Never  Other Topics Concern   Not on file  Social History Narrative   Exercises regularly.   Social Drivers of Corporate investment banker Strain: Not on file  Food Insecurity: Not on file  Transportation Needs: Not on file  Physical Activity: Not on file  Stress: Not on file  Social Connections: Not on file  Intimate Partner Violence: Not on file    Family History  Problem Relation Age of Onset   Hypertension Mother    Hyperlipidemia Mother    Hypertension Maternal Grandmother    Emphysema Maternal Grandmother     Hypertension Maternal Grandfather    Alcohol abuse Maternal Grandfather    Cancer Paternal Grandmother    Congestive Heart Failure Paternal Grandfather      Review of Systems  Constitutional: Negative.  Negative for chills and fever.  HENT: Negative.  Negative for congestion and sore throat.   Respiratory: Negative.  Negative for cough and shortness of breath.   Cardiovascular: Negative.  Negative for chest pain and palpitations.  Gastrointestinal:  Negative for abdominal pain, nausea and vomiting.  Genitourinary: Negative.  Negative for dysuria and hematuria.  Skin: Negative.  Negative for rash.  Neurological: Negative.  Negative for dizziness and headaches.  All other systems reviewed and are negative.   Vitals:   04/26/23 0840  BP: 120/78  Pulse: 84  Temp: 98.3 F (36.8 C)  SpO2: 99%    Physical Exam Vitals reviewed.  Constitutional:      Appearance: Normal appearance.  HENT:     Head: Normocephalic.     Right Ear: Tympanic membrane, ear canal and external ear normal.     Left Ear: Tympanic membrane, ear canal and external ear normal.     Mouth/Throat:     Mouth: Mucous membranes are moist.     Pharynx: Oropharynx is clear.  Eyes:     Extraocular Movements: Extraocular movements intact.     Conjunctiva/sclera: Conjunctivae normal.     Pupils: Pupils are equal, round, and reactive to light.  Cardiovascular:     Rate and Rhythm: Normal rate and regular rhythm.     Pulses: Normal pulses.     Heart sounds: Normal heart sounds.  Pulmonary:     Effort: Pulmonary effort is normal.     Breath sounds: Normal breath sounds.  Abdominal:     Palpations: Abdomen is soft.     Tenderness: There is no abdominal tenderness.  Musculoskeletal:     Cervical back: No tenderness.  Lymphadenopathy:     Cervical: No cervical adenopathy.  Skin:    General: Skin is warm and dry.     Capillary Refill: Capillary refill takes less than 2 seconds.  Neurological:     General: No  focal deficit present.     Mental Status: He is alert and oriented to person, place, and time.  Psychiatric:        Mood and Affect: Mood normal.  Behavior: Behavior normal.      ASSESSMENT & PLAN: Problem List Items Addressed This Visit       Other   Situational anxiety   Relevant Medications   hydrOXYzine (ATARAX) 25 MG tablet   Other Visit Diagnoses       Routine general medical examination at a health care facility    -  Primary   Relevant Orders   PSA   CBC with Differential/Platelet   Comprehensive metabolic panel   Hemoglobin A1c   Lipid panel   Vitamin B12   VITAMIN D 25 Hydroxy (Vit-D Deficiency, Fractures)   TSH     Colon cancer screening       Relevant Orders   Ambulatory referral to Gastroenterology     Screening for deficiency anemia       Relevant Orders   CBC with Differential/Platelet     Screening for lipoid disorders       Relevant Orders   Lipid panel     Screening for endocrine, metabolic and immunity disorder       Relevant Orders   Comprehensive metabolic panel   Hemoglobin A1c   Vitamin B12   VITAMIN D 25 Hydroxy (Vit-D Deficiency, Fractures)   TSH     Screening for prostate cancer       Relevant Orders   PSA      Modifiable risk factors discussed with patient. Anticipatory guidance according to age provided. The following topics were also discussed: Social Determinants of Health Smoking.  Non-smoker Diet and nutrition Benefits of exercise Cancer screening and need for colon cancer screening with colonoscopy Vaccinations review and recommendations Cardiovascular risk assessment and need for blood work Mental health including depression and anxiety Fall and accident prevention  Patient Instructions  Health Maintenance, Male Adopting a healthy lifestyle and getting preventive care are important in promoting health and wellness. Ask your health care provider about: The right schedule for you to have regular tests and  exams. Things you can do on your own to prevent diseases and keep yourself healthy. What should I know about diet, weight, and exercise? Eat a healthy diet  Eat a diet that includes plenty of vegetables, fruits, low-fat dairy products, and lean protein. Do not eat a lot of foods that are high in solid fats, added sugars, or sodium. Maintain a healthy weight Body mass index (BMI) is a measurement that can be used to identify possible weight problems. It estimates body fat based on height and weight. Your health care provider can help determine your BMI and help you achieve or maintain a healthy weight. Get regular exercise Get regular exercise. This is one of the most important things you can do for your health. Most adults should: Exercise for at least 150 minutes each week. The exercise should increase your heart rate and make you sweat (moderate-intensity exercise). Do strengthening exercises at least twice a week. This is in addition to the moderate-intensity exercise. Spend less time sitting. Even light physical activity can be beneficial. Watch cholesterol and blood lipids Have your blood tested for lipids and cholesterol at 47 years of age, then have this test every 5 years. You may need to have your cholesterol levels checked more often if: Your lipid or cholesterol levels are high. You are older than 47 years of age. You are at high risk for heart disease. What should I know about cancer screening? Many types of cancers can be detected early and may often be prevented. Depending on your  health history and family history, you may need to have cancer screening at various ages. This may include screening for: Colorectal cancer. Prostate cancer. Skin cancer. Lung cancer. What should I know about heart disease, diabetes, and high blood pressure? Blood pressure and heart disease High blood pressure causes heart disease and increases the risk of stroke. This is more likely to develop in  people who have high blood pressure readings or are overweight. Talk with your health care provider about your target blood pressure readings. Have your blood pressure checked: Every 3-5 years if you are 62-35 years of age. Every year if you are 11 years old or older. If you are between the ages of 62 and 35 and are a current or former smoker, ask your health care provider if you should have a one-time screening for abdominal aortic aneurysm (AAA). Diabetes Have regular diabetes screenings. This checks your fasting blood sugar level. Have the screening done: Once every three years after age 15 if you are at a normal weight and have a low risk for diabetes. More often and at a younger age if you are overweight or have a high risk for diabetes. What should I know about preventing infection? Hepatitis B If you have a higher risk for hepatitis B, you should be screened for this virus. Talk with your health care provider to find out if you are at risk for hepatitis B infection. Hepatitis C Blood testing is recommended for: Everyone born from [birth year] through 1964. Anyone with known risk factors for hepatitis C. Sexually transmitted infections (STIs) You should be screened each year for STIs, including gonorrhea and chlamydia, if: You are sexually active and are younger than 47 years of age. You are older than 47 years of age and your health care provider tells you that you are at risk for this type of infection. Your sexual activity has changed since you were last screened, and you are at increased risk for chlamydia or gonorrhea. Ask your health care provider if you are at risk. Ask your health care provider about whether you are at high risk for HIV. Your health care provider may recommend a prescription medicine to help prevent HIV infection. If you choose to take medicine to prevent HIV, you should first get tested for HIV. You should then be tested every 3 months for as long as you are taking the  medicine. Follow these instructions at home: Alcohol use Do not drink alcohol if your health care provider tells you not to drink. If you drink alcohol: Limit how much you have to 0-2 drinks a day. Know how much alcohol is in your drink. In the [place] one drink equals one 12 oz bottle of beer (355 mL), one 5 oz glass of wine (148 mL), or one 1 oz glass of hard liquor (44 mL). Lifestyle Do not use any products that contain nicotine or tobacco. These products include cigarettes, chewing tobacco, and vaping devices, such as e-cigarettes. If you need help quitting, ask your health care provider. Do not use street drugs. Do not share needles. Ask your health care provider for help if you need support or information about quitting drugs. General instructions Schedule regular health, dental, and eye exams. Stay current with your vaccines. Tell your health care provider if: You often feel depressed. You have ever been abused or do not feel safe at home. Summary Adopting a healthy lifestyle and getting preventive care are important in promoting health and wellness. Follow your health care  provider's instructions about healthy diet, exercising, and getting tested or screened for diseases. Follow your health care provider's instructions on monitoring your cholesterol and blood pressure. This information is not intended to replace advice given to you by your health care provider. Make sure you discuss any questions you have with your health care provider. Document Revised: 06/29/2020 Document Reviewed: 06/29/2020 Elsevier Patient Education  2024 Elsevier Inc.      Edwina Barth, MD [facility]

## 2023-05-02 DIAGNOSIS — D3131 Benign neoplasm of right choroid: Secondary | ICD-10-CM | POA: Diagnosis not present

## 2023-05-02 DIAGNOSIS — H10413 Chronic giant papillary conjunctivitis, bilateral: Secondary | ICD-10-CM | POA: Diagnosis not present

## 2023-05-02 DIAGNOSIS — H2513 Age-related nuclear cataract, bilateral: Secondary | ICD-10-CM | POA: Diagnosis not present

## 2023-05-02 DIAGNOSIS — H0102A Squamous blepharitis right eye, upper and lower eyelids: Secondary | ICD-10-CM | POA: Diagnosis not present

## 2023-05-05 ENCOUNTER — Encounter: Payer: Self-pay | Admitting: Gastroenterology

## 2023-05-18 ENCOUNTER — Ambulatory Visit (AMBULATORY_SURGERY_CENTER)

## 2023-05-18 VITALS — Ht 60.0 in | Wt 139.0 lb

## 2023-05-18 DIAGNOSIS — Z1211 Encounter for screening for malignant neoplasm of colon: Secondary | ICD-10-CM

## 2023-05-18 MED ORDER — PEG 3350-KCL-NA BICARB-NACL 420 G PO SOLR: 4000.0000 mL | Freq: Once | ORAL | 0 refills | Status: AC

## 2023-05-18 NOTE — Progress Notes (Signed)
 PV completed with patient and mother (Legal Guardian) No egg or soy allergy known to patient  No issues known to pt with past sedation with any surgeries or procedures Patient denies ever being told they had issues or difficulty with intubation  No FH of Malignant Hyperthermia Pt is not on diet pills Pt is not on  home 02  Pt is not on blood thinners  Pt denies issues with constipation  No A fib or A flutter Have any cardiac testing pending--No Pt can ambulate  Pt denies use of chewing tobacco Discussed diabetic I weight loss medication holds Discussed NSAID holds Checked BMI Pt instructed to use Singlecare.com or GoodRx for a price reduction on prep  Patient's chart reviewed by Cathlyn Parsons CNRA prior to previsit and patient appropriate for the [facility].  Pre visit completed and red dot placed by patient's name on their procedure day (on provider's schedule).

## 2023-05-30 ENCOUNTER — Encounter: Payer: Self-pay | Admitting: Gastroenterology

## 2023-06-01 ENCOUNTER — Ambulatory Visit: Admitting: Gastroenterology

## 2023-06-01 ENCOUNTER — Encounter: Payer: Self-pay | Admitting: Gastroenterology

## 2023-06-01 VITALS — BP 100/54 | HR 81 | Temp 98.2°F | Resp 10 | Ht 60.0 in | Wt 139.0 lb

## 2023-06-01 DIAGNOSIS — Z1211 Encounter for screening for malignant neoplasm of colon: Secondary | ICD-10-CM | POA: Diagnosis not present

## 2023-06-01 MED ORDER — SODIUM CHLORIDE 0.9 % IV SOLN: 500.0000 mL | Freq: Once | INTRAVENOUS | Status: DC

## 2023-06-01 NOTE — Op Note (Signed)
 [facility] Patient Name: Zachary Wheeler Procedure Date: 06/01/2023 12:40 PM MRN: [id number] Endoscopist: Lorin Picket E. Tomasa Rand , MD, [id number] Age: 48 Referring MD:  Date of Birth: 09-Feb-1976 Gender: Male Account #: [id number] Procedure:                Colonoscopy Indications:              Screening for colorectal malignant neoplasm, This                            is the patient's first colonoscopy Medicines:                Monitored Anesthesia Care Procedure:                Pre-Anesthesia Assessment:                           - Prior to the procedure, a History and Physical                            was performed, and patient medications and                            allergies were reviewed. The patient's tolerance of                            previous anesthesia was also reviewed. The risks                            and benefits of the procedure and the sedation                            options and risks were discussed with the patient.                            All questions were answered, and informed consent                            was obtained. Prior Anticoagulants: The patient has                            taken no anticoagulant or antiplatelet agents. ASA                            Grade Assessment: II - A patient with mild systemic                            disease. After reviewing the risks and benefits,                            the patient was deemed in satisfactory condition to                            undergo the procedure.  After obtaining informed consent, the colonoscope                            was passed under direct vision. Throughout the                            procedure, the patient's blood pressure, pulse, and                            oxygen saturations were monitored continuously. The                            Olympus Scope SN: [id number] was introduced through                            the anus and advanced  to the the terminal ileum,                            with identification of the appendiceal orifice and                            IC valve. The colonoscopy was performed without                            difficulty. The patient tolerated the procedure                            well. The quality of the bowel preparation was                            adequate. The terminal ileum, ileocecal valve,                            appendiceal orifice, and rectum were photographed.                            The bowel preparation used was GoLYTELY via split                            dose instruction. Scope In: 1:30:03 PM Scope Out: 1:42:55 PM Scope Withdrawal Time: 0 hours 8 minutes 0 seconds  Total Procedure Duration: 0 hours 12 minutes 52 seconds  Findings:                 The perianal and digital rectal examinations were                            normal. Pertinent negatives include normal                            sphincter tone and no palpable rectal lesions.                           The colon (entire examined portion) appeared normal.  The terminal ileum appeared normal.                           The retroflexed view of the distal rectum and anal                            verge was normal and showed no anal or rectal                            abnormalities. Complications:            No immediate complications. Estimated Blood Loss:     Estimated blood loss: none. Impression:               - The entire examined colon is normal.                           - The examined portion of the ileum was normal.                           - The distal rectum and anal verge are normal on                            retroflexion view.                           - No specimens collected. Recommendation:           - Patient has a contact number available for                            emergencies. The signs and symptoms of potential                            delayed  complications were discussed with the                            patient. Return to normal activities tomorrow.                            Written discharge instructions were provided to the                            patient.                           - Resume previous diet.                           - Continue present medications.                           - Repeat colonoscopy in 10 years for screening                            purposes. Harvin Konicek E. Tomasa Rand, MD 06/01/2023 1:48:28 PM This report has been signed electronically.

## 2023-06-01 NOTE — Progress Notes (Unsigned)
 Pt's states no medical or surgical changes since previsit or office visit.

## 2023-06-01 NOTE — Patient Instructions (Signed)

## 2023-06-01 NOTE — Progress Notes (Unsigned)
 [facility] History and Physical   Primary Care Physician:  Georgina Quint, MD   Reason for Procedure:   Colon cancer screening  Plan:    Screening colonoscopy     HPI: Zachary Wheeler is a 48 y.o. male undergoing initial average risk screening colonoscopy.  He has no family history of colon cancer and no chronic GI symptoms.    Past Medical History:  Diagnosis Date   Allergy    Cancer (HCC) 12/02/13   seminoma   Chicken pox    Down syndrome    Trisomy 21   Family history of anesthesia complication    mother has PONV   Gall stones    Gallstones    Hematuria    History of glomerulonephritis nephrologist-  dr Hyman Hopes   2002 resolved without kidney damage   S/P radiation therapy 07/22/2014 through 08/14/2014                                                      Right hockey stick (periaortic/right iliac lymph nodes) 2550 cGy in 15 sessions, para aortic lymph node boost 60 cGy in 3 sessions                         Trisomy 21     Past Surgical History:  Procedure Laterality Date   ORCHIECTOMY Right 12/02/2013   Procedure: INGUINAL ORCHIECTOMY;  Surgeon: Chelsea Aus, MD;  Location: [facility];  Service: Urology;  Laterality: Right;   ROOT CANAL      Prior to Admission medications   Medication Sig Start Date End Date Taking? Authorizing Provider  Cholecalciferol (VITAMIN D) 50 MCG (2000 UT) CAPS Take 1 capsule by mouth daily.   Yes [provider]  hydrOXYzine (ATARAX) 25 MG tablet Take 1 tablet (25 mg total) by mouth at bedtime as needed. 04/26/23  Yes Sagardia, Eilleen Kempf, MD  fluticasone [facility]) 50 MCG/ACT nasal spray Place 1 spray into both nostrils daily.    [provider]  ibuprofen (ADVIL,MOTRIN) 200 MG tablet Take 200 mg by mouth every 6 (six) hours as needed.    [provider]  Melatonin 10 MG CHEW Chew 2 tablets by mouth at bedtime. PRN    [provider]    Current Outpatient  Medications  Medication Sig Dispense Refill   Cholecalciferol (VITAMIN D) 50 MCG (2000 UT) CAPS Take 1 capsule by mouth daily.     hydrOXYzine (ATARAX) 25 MG tablet Take 1 tablet (25 mg total) by mouth at bedtime as needed. 30 tablet 1   fluticasone (FLONASE) 50 MCG/ACT nasal spray Place 1 spray into both nostrils daily.     ibuprofen (ADVIL,MOTRIN) 200 MG tablet Take 200 mg by mouth every 6 (six) hours as needed.     Melatonin 10 MG CHEW Chew 2 tablets by mouth at bedtime. PRN     Current Facility-Administered Medications  Medication Dose Route Frequency Provider Last Rate Last Admin   0.9 %  sodium chloride infusion  500 mL Intravenous Once Jenel Lucks, MD        Allergies as of 06/01/2023 - Review Complete 06/01/2023  Allergen Reaction Noted   Codeine Nausea Only 05/09/2011   Sulfa antibiotics Rash 05/09/2011   Terbinafine hcl Rash 05/09/2011    Family  History  Problem Relation Age of Onset   Hypertension Mother    Hyperlipidemia Mother    Colon cancer Maternal Grandmother    Hypertension Maternal Grandmother    Emphysema Maternal Grandmother    Hypertension Maternal Grandfather    Alcohol abuse Maternal Grandfather    Cancer Paternal Grandmother    Congestive Heart Failure Paternal Grandfather    Rectal cancer Neg Hx    Esophageal cancer Neg Hx     Social History   Socioeconomic History   Marital status: Single    Spouse name: Not on file   Number of children: Not on file   Years of education: 12   Highest education level: Not on file  Occupational History   Occupation: janitor  Tobacco Use   Smoking status: Never   Smokeless tobacco: Never  Vaping Use   Vaping status: Never Used  Substance and Sexual Activity   Alcohol use: Not Currently   Drug use: No   Sexual activity: Never  Other Topics Concern   Not on file  Social History Narrative   Exercises regularly.   Social Drivers of Corporate investment banker Strain: Not on file  Food  Insecurity: Not on file  Transportation Needs: Not on file  Physical Activity: Not on file  Stress: Not on file  Social Connections: Not on file  Intimate Partner Violence: Not on file    Review of Systems:  All other review of systems negative except as mentioned in the HPI.  Physical Exam: Vital signs BP 106/62   Pulse 74   Temp 98.2 F (36.8 C) (Temporal)   Ht 5' (1.524 m)   Wt 139 lb (63 kg)   SpO2 100%   BMI 27.15 kg/m   General:   Alert,  Well-developed, well-nourished, pleasant and cooperative in NAD Airway:  Mallampati 2 Lungs:  Clear throughout to auscultation.   Heart:  Regular rate and rhythm; no murmurs, clicks, rubs,  or gallops. Abdomen:  Soft, nontender and nondistended. Normal bowel sounds.   Neuro/Psych:  Normal mood and affect. A and O x 3   Greely Atiyeh E. Tomasa Rand, MD [facility]

## 2023-06-01 NOTE — Progress Notes (Unsigned)
 Sedate, gd SR, tolerated procedure well, VSS, report to RN

## 2023-06-02 ENCOUNTER — Telehealth: Payer: Self-pay

## 2023-06-02 NOTE — Telephone Encounter (Signed)
  Follow up Call-     06/01/2023    1:00 PM  Call back number  Post procedure Call Back phone  # [phone]  Permission to leave phone message Yes     Patient questions:  Do you have a fever, pain , or abdominal swelling? No. Pain Score  0 *  Have you tolerated food without any problems? Yes.    Have you been able to return to your normal activities? Yes.    Do you have any questions about your discharge instructions: Diet   No. Medications  No. Follow up visit  No.  Do you have questions or concerns about your Care? No.  Actions: * If pain score is 4 or above: No action needed, pain <4.

## 2023-09-19 DIAGNOSIS — K080 Exfoliation of teeth due to systemic causes: Secondary | ICD-10-CM | POA: Diagnosis not present

## 2023-09-28 ENCOUNTER — Encounter: Payer: Self-pay | Admitting: Emergency Medicine

## 2023-09-28 ENCOUNTER — Ambulatory Visit (INDEPENDENT_AMBULATORY_CARE_PROVIDER_SITE_OTHER): Admitting: Emergency Medicine

## 2023-09-28 ENCOUNTER — Ambulatory Visit: Payer: Self-pay | Admitting: Emergency Medicine

## 2023-09-28 VITALS — BP 104/72 | HR 70 | Temp 98.2°F | Ht 60.0 in | Wt 137.0 lb

## 2023-09-28 DIAGNOSIS — K920 Hematemesis: Secondary | ICD-10-CM

## 2023-09-28 DIAGNOSIS — F418 Other specified anxiety disorders: Secondary | ICD-10-CM | POA: Diagnosis not present

## 2023-09-28 LAB — CBC WITH DIFFERENTIAL/PLATELET
Basophils Absolute: 0.1 K/uL (ref 0.0–0.1)
Basophils Relative: 0.9 % (ref 0.0–3.0)
Eosinophils Absolute: 0 K/uL (ref 0.0–0.7)
Eosinophils Relative: 0.2 % (ref 0.0–5.0)
HCT: 45.6 % (ref 39.0–52.0)
Hemoglobin: 15.1 g/dL (ref 13.0–17.0)
Lymphocytes Relative: 24 % (ref 12.0–46.0)
Lymphs Abs: 1.7 K/uL (ref 0.7–4.0)
MCHC: 33.1 g/dL (ref 30.0–36.0)
MCV: 95.2 fl (ref 78.0–100.0)
Monocytes Absolute: 0.8 K/uL (ref 0.1–1.0)
Monocytes Relative: 11.6 % (ref 3.0–12.0)
Neutro Abs: 4.6 K/uL (ref 1.4–7.7)
Neutrophils Relative %: 63.3 % (ref 43.0–77.0)
Platelets: 224 K/uL (ref 150.0–400.0)
RBC: 4.8 Mil/uL (ref 4.22–5.81)
RDW: 14 % (ref 11.5–15.5)
WBC: 7.2 K/uL (ref 4.0–10.5)

## 2023-09-28 LAB — COMPREHENSIVE METABOLIC PANEL WITH GFR
ALT: 13 U/L (ref 0–53)
AST: 16 U/L (ref 0–37)
Albumin: 3.8 g/dL (ref 3.5–5.2)
Alkaline Phosphatase: 68 U/L (ref 39–117)
BUN: 12 mg/dL (ref 6–23)
CO2: 30 meq/L (ref 19–32)
Calcium: 9.3 mg/dL (ref 8.4–10.5)
Chloride: 100 meq/L (ref 96–112)
Creatinine, Ser: 0.96 mg/dL (ref 0.40–1.50)
GFR: 94.17 mL/min (ref >60.00)
Glucose, Bld: 90 mg/dL (ref 70–99)
Potassium: 4.1 meq/L (ref 3.5–5.1)
Sodium: 139 meq/L (ref 135–145)
Total Bilirubin: 0.8 mg/dL (ref 0.2–1.2)
Total Protein: 7 g/dL (ref 6.0–8.3)

## 2023-09-28 LAB — LIPASE: Lipase: 92 U/L — ABNORMAL HIGH (ref 11.0–59.0)

## 2023-09-28 MED ORDER — PANTOPRAZOLE SODIUM 40 MG PO TBEC: 40.0000 mg | DELAYED_RELEASE_TABLET | Freq: Every day | ORAL | 3 refills | Status: AC

## 2023-09-28 MED ORDER — BUSPIRONE HCL 7.5 MG PO TABS: 7.5000 mg | ORAL_TABLET | Freq: Two times a day (BID) | ORAL | 1 refills | Status: AC

## 2023-09-28 NOTE — Patient Instructions (Signed)

## 2023-09-28 NOTE — Progress Notes (Signed)
 [place] 48 y.o.   Chief Complaint  Patient presents with   Anxiety    Patient here for anxiety, patients anxiety has gotten worse, patient will get so much anxiety it makes him sick to the point he throws up. Yesterday he vomited bright red blood and states it was a good amount. Lately he's not been himself and been more anxious and worried      HISTORY OF PRESENT ILLNESS: This is a 48 y.o. male complaining of nausea and vomiting secondary to anxiety issues Anxiety making him throw up.  Noticed blood in the vomit 2 days ago No other complaints or medical concerns today.  Anxiety Symptoms include nausea and nervous/anxious behavior. Patient reports no chest pain, palpitations or shortness of breath.       Prior to Admission medications   Medication Sig Start Date End Date Taking? Authorizing Provider  Cholecalciferol (VITAMIN D ) 50 MCG (2000 UT) CAPS Take 1 capsule by mouth daily.   Yes [provider]  fluticasone (FLONASE) 50 MCG/ACT nasal spray Place 1 spray into both nostrils daily.   Yes [provider]  hydrOXYzine  (ATARAX ) 25 MG tablet Take 1 tablet (25 mg total) by mouth at bedtime as needed. 04/26/23  Yes Charmian Forbis Jose, MD  ibuprofen (ADVIL,MOTRIN) 200 MG tablet Take 200 mg by mouth every 6 (six) hours as needed.   Yes [provider]  Melatonin 10 MG CHEW Chew 2 tablets by mouth at bedtime. 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   Trisomy 21    Hx of gross hematuria     Past Medical History:  Diagnosis Date   Allergy    Cancer (HCC) 12/02/13   seminoma   Chicken pox    Down syndrome    Trisomy 21   Family history of anesthesia complication     mother has PONV   Gall stones    Gallstones    Hematuria    History of glomerulonephritis nephrologist-  dr douglass   2002 resolved without kidney damage   S/P radiation therapy 07/22/2014 through 08/14/2014                                                      Right hockey stick (periaortic/right iliac lymph nodes) 2550 cGy in 15 sessions, para aortic lymph node boost 60 cGy in 3 sessions                         Trisomy 21     Past Surgical History:  Procedure Laterality Date   ORCHIECTOMY Right 12/02/2013   Procedure: INGUINAL ORCHIECTOMY;  Surgeon: Garnette CHRISTELLA Shack, MD;  Location: [facility];  Service: Urology;  Laterality: Right;   ROOT CANAL      Social History   Socioeconomic History   Marital status: Single    Spouse name: Not on file   Number of children: Not on file   Years of education: 12   Highest education  level: Not on file  Occupational History   Occupation: janitor  Tobacco Use   Smoking status: Never   Smokeless tobacco: Never  Vaping Use   Vaping status: Never Used  Substance and Sexual Activity   Alcohol use: Not Currently   Drug use: No   Sexual activity: Never  Other Topics Concern   Not on file  Social History Narrative   Exercises regularly.   Social Drivers of Corporate investment banker Strain: Not on file  Food Insecurity: Not on file  Transportation Needs: Not on file  Physical Activity: Not on file  Stress: Not on file  Social Connections: Not on file  Intimate Partner Violence: Not on file    Family History  Problem Relation Age of Onset   Hypertension Mother    Hyperlipidemia Mother    Colon cancer Maternal Grandmother    Hypertension Maternal Grandmother    Emphysema Maternal Grandmother    Hypertension Maternal Grandfather    Alcohol abuse Maternal Grandfather    Cancer Paternal Grandmother    Congestive Heart Failure Paternal Grandfather    Rectal cancer Neg Hx    Esophageal cancer Neg Hx       Review of Systems  Constitutional: Negative.  Negative for chills and fever.  HENT: Negative.  Negative for congestion and sore throat.   Respiratory: Negative.  Negative for cough and shortness of breath.   Cardiovascular: Negative.  Negative for chest pain and palpitations.  Gastrointestinal:  Positive for nausea and vomiting.  Genitourinary: Negative.  Negative for dysuria and hematuria.  Skin: Negative.  Negative for rash.  Psychiatric/Behavioral:  The patient is nervous/anxious.   All other systems reviewed and are negative.   Vitals:   09/28/23 0941  BP: 104/72  Pulse: 70  Temp: 98.2 F (36.8 C)  SpO2: 99%    Physical Exam Vitals reviewed.  Constitutional:      Appearance: Normal appearance.  HENT:     Head: Normocephalic.     Mouth/Throat:     Mouth: Mucous membranes are moist.     Pharynx: Oropharynx is clear.  Eyes:     Extraocular Movements: Extraocular movements intact.     Pupils: Pupils are equal, round, and reactive to light.  Cardiovascular:     Rate and Rhythm: Normal rate and regular rhythm.     Pulses: Normal pulses.     Heart sounds: Normal heart sounds.  Pulmonary:     Effort: Pulmonary effort is normal.     Breath sounds: Normal breath sounds.  Abdominal:     Palpations: Abdomen is soft.     Tenderness: There is no abdominal tenderness.  Musculoskeletal:     Cervical back: No tenderness.  Lymphadenopathy:     Cervical: No cervical adenopathy.  Skin:    General: Skin is warm and dry.  Neurological:     Mental Status: He is alert and oriented to person, place, and time.  Psychiatric:        Mood and Affect: Mood normal.        Behavior: Behavior normal.      ASSESSMENT & PLAN: A total of 42 minutes was spent with the patient and counseling/coordination of care regarding preparing for this visit, review of most recent office visit notes, review of multiple chronic medical conditions and their management, review of all medications  and changes made, mental health and stress management, need for psychiatric evaluation, review of most recent bloodwork results, review of health maintenance items, education  on nutrition, prognosis, documentation, and need for follow up.   Problem List Items Addressed This Visit       Digestive   Hematemesis with nausea - Primary   Clinically stable.  No longer vomiting. Most likely result of a high anxiety state Recommend blood work today Start pantoprazole  40 mg for 3 to 4 weeks Anxiety management discussed May need GI evaluation in the near future      Relevant Medications   pantoprazole  (PROTONIX ) 40 MG tablet   Other Relevant Orders   Comprehensive metabolic panel with GFR   CBC with Differential/Platelet   Lipase     Other   Situational anxiety   Known triggers.  Affecting quality of life and contributing to symptoms a great deal. Recommend buspirone  7.5 mg twice a day as needed Recommend psychiatric evaluation.  Referral placed today.      Relevant Medications   busPIRone  (BUSPAR ) 7.5 MG tablet   Other Relevant Orders   Ambulatory referral to Psychology   Patient Instructions  Generalized Anxiety Disorder, Adult Generalized anxiety disorder (GAD) is a mental health condition. Unlike normal worries, anxiety related to GAD is not triggered by a specific event. These worries do not fade or get better with time. GAD interferes with relationships, work, and school. GAD symptoms can vary from mild to severe. People with severe GAD can have intense waves of anxiety with physical symptoms that are similar to panic attacks. What are the causes? The exact cause of GAD is not known, but the following are believed to have an impact: Differences in natural brain chemicals. Genes passed down from parents to children. Differences in the way threats are perceived. Development and stress during childhood. Personality. What increases the risk? The following factors may make you  more likely to develop this condition: Being male. Having a family history of anxiety disorders. Being very shy. Experiencing very stressful life events, such as the death of a loved one. Having a very stressful family environment. What are the signs or symptoms? People with GAD often worry excessively about many things in their lives, such as their health and family. Symptoms may also include: Mental and emotional symptoms: Worrying excessively about natural disasters. Fear of being late. Difficulty concentrating. Fears that others are judging your performance. Physical symptoms: Fatigue. Headaches, muscle tension, muscle twitches, trembling, or feeling shaky. Feeling like your heart is pounding or beating very fast. Feeling out of breath or like you cannot take a deep breath. Having trouble falling asleep or staying asleep, or experiencing restlessness. Sweating. Nausea, diarrhea, or irritable bowel syndrome (IBS). Behavioral symptoms: Experiencing erratic moods or irritability. Avoidance of new situations. Avoidance of people. Extreme difficulty making decisions. How is this diagnosed? This condition is diagnosed based on your symptoms and medical history. You will also have a physical exam. Your health care provider may perform tests to rule out other possible causes of your symptoms. To be diagnosed with GAD, a person must have anxiety that: Is out of his or her control. Affects several different aspects of his or her life, such as work and relationships. Causes distress that makes him or her unable to take part in normal activities. Includes at least three symptoms of GAD, such as restlessness, fatigue, trouble concentrating, irritability, muscle tension, or sleep problems. Before your health care provider can confirm a diagnosis of GAD, these symptoms must be present more days than they are not, and they must last for 6 months or longer. How is this treated? This  condition  may be treated with: Medicine. Antidepressant medicine is usually prescribed for long-term daily control. Anti-anxiety medicines may be added in severe cases, especially when panic attacks occur. Talk therapy (psychotherapy). Certain types of talk therapy can be helpful in treating GAD by providing support, education, and guidance. Options include: Cognitive behavioral therapy (CBT). People learn coping skills and self-calming techniques to ease their physical symptoms. They learn to identify unrealistic thoughts and behaviors and to replace them with more appropriate thoughts and behaviors. Acceptance and commitment therapy (ACT). This treatment teaches people how to be mindful as a way to cope with unwanted thoughts and feelings. Biofeedback. This process trains you to manage your body's response (physiological response) through breathing techniques and relaxation methods. You will work with a therapist while machines are used to monitor your physical symptoms. Stress management techniques. These include yoga, meditation, and exercise. A mental health specialist can help determine which treatment is best for you. Some people see improvement with one type of therapy. However, other people require a combination of therapies. Follow these instructions at home: Lifestyle Maintain a consistent routine and schedule. Anticipate stressful situations. Create a plan and allow extra time to work with your plan. Practice stress management or self-calming techniques that you have learned from your therapist or your health care provider. Exercise regularly and spend time outdoors. Eat a healthy diet that includes plenty of vegetables, fruits, whole grains, low-fat dairy products, and lean protein. Do not eat a lot of foods that are high in fat, added sugar, or salt (sodium). Drink plenty of water. Avoid alcohol. Alcohol can increase anxiety. Avoid caffeine and certain over-the-counter cold medicines. These may  make you feel worse. Ask your pharmacist which medicines to avoid. General instructions Take over-the-counter and prescription medicines only as told by your health care provider. Understand that you are likely to have setbacks. Accept this and be kind to yourself as you persist to take better care of yourself. Anticipate stressful situations. Create a plan and allow extra time to work with your plan. Recognize and accept your accomplishments, even if you judge them as small. Spend time with people who care about you. Keep all follow-up visits. This is important. Where to find more information [employer] of Mental Health: http://www.maynard.net/ Substance Abuse and Mental Health Services: SkateOasis.com.pt Contact a health care provider if: Your symptoms do not get better. Your symptoms get worse. You have signs of depression, such as: A persistently sad or irritable mood. Loss of enjoyment in activities that used to bring you joy. Change in weight or eating. Changes in sleeping habits. Get help right away if: You have thoughts about hurting yourself or others. If you ever feel like you may hurt yourself or others, or have thoughts about taking your own life, get help right away. Go to your nearest emergency department or: Call your local emergency services (911 in the [place] Call a suicide crisis helpline, such as the National Suicide Prevention Lifeline at 623-806-4656 or 988 in the [place] This is open 24 hours a day in the [place] If you're a Veteran: Call 988 and press 1. This is open 24 hours a day. Text the [employer] at 989-748-0268. Summary Generalized anxiety disorder (GAD) is a mental health condition that involves worry that is not triggered by a specific event. People with GAD often worry excessively about many things in their lives, such as their health and family. GAD may cause symptoms such as restlessness, trouble concentrating, sleep problems, frequent  sweating, nausea,  diarrhea, headaches, and trembling or muscle twitching. A mental health specialist can help determine which treatment is best for you. Some people see improvement with one type of therapy. However, other people require a combination of therapies. This information is not intended to replace advice given to you by your health care provider. Make sure you discuss any questions you have with your health care provider. Document Revised: 09/22/2022 Document Reviewed: 05/31/2020 Elsevier Patient Education  2024 Elsevier Inc.    Emil Schaumann, MD [facility]

## 2023-09-28 NOTE — Assessment & Plan Note (Signed)
 Clinically stable.  No longer vomiting. Most likely result of a high anxiety state Recommend blood work today Start pantoprazole  40 mg for 3 to 4 weeks Anxiety management discussed May need GI evaluation in the near future

## 2023-09-28 NOTE — Assessment & Plan Note (Signed)
 Known triggers.  Affecting quality of life and contributing to symptoms a great deal. Recommend buspirone  7.5 mg twice a day as needed Recommend psychiatric evaluation.  Referral placed today.

## 2023-10-06 ENCOUNTER — Ambulatory Visit (INDEPENDENT_AMBULATORY_CARE_PROVIDER_SITE_OTHER): Admitting: Psychology

## 2023-10-06 ENCOUNTER — Encounter: Payer: Self-pay | Admitting: Psychology

## 2023-10-06 DIAGNOSIS — F4010 Social phobia, unspecified: Secondary | ICD-10-CM | POA: Diagnosis not present

## 2023-10-06 DIAGNOSIS — F79 Unspecified intellectual disabilities: Secondary | ICD-10-CM

## 2023-10-06 NOTE — Progress Notes (Signed)
   Zachary Dames, PhD

## 2023-10-06 NOTE — Progress Notes (Signed)
 [facility] Behavioral Health Counselor Initial Adult Exam  Name: [place] Date: 10/06/2023 MRN: [id number] DOB: May 11, 1975 PCP: Zachary Emil Schanz, MD  Time spent: 3:00 - 4:00 pm  Guardian/informant:  [place] - Patient [place] - [place]    Paperwork requested: No   Met with patient and parent ([place]) for initial interview.  Patient and parent were at the clinic and session was conducted from therapist's office in person.    Reason for Visit /Presenting Problem: Patient was referred tot address excessive anxiety concerns.  Patient has had anxiety for a long time.  He has trouble sleeping and will vomit when he becomes highly upset, including throwing up blood.  Medication has not been helpful.  Patient diagnosed with Downs syndrome and there is a strong family history of anxiety.    Mental Status Exam: Appearance:   Neat and Well Groomed     Behavior:  Appropriate and Sharing  Motor:  Normal  Speech/Language:   Clear and Coherent and Normal Rate  Affect:  Constricted  Mood:  euthymic  Thought process:  normal  Thought content:    WNL  Sensory/Perceptual disturbances:    WNL  Orientation:  oriented to person, place, time/date, and situation  Attention:  Good  Concentration:  Good  Memory:  WNL  Fund of knowledge:   Fair  Insight:    Fair  Judgment:   Good  Impulse Control:  Good   Developmental History: Early delays - Had some delays as a child.  [place] 71[place]  Participated in a remedial education program at [place].  His progress led to him being the first student with DS attending a mainstream classroom in [place].  Repeated K and 2nd grade.  Moved to [place] in 1991.  Played in band through HS ([place]).  Motor -.Great coordination.  Plays pickle ball and machine. Adequate with fine motor.    Speech - Speaks clearly and has a good vocabulary.  Reads a lot. Self Care - Good  Independent - Father drives  pt. to work 1 day per week.  Able to do household chores.  Could sustain himself for about 4-5 days if given instructions.     Social -Has friends but are spread out.  Speaks to people at church but best friends are his family.  Reported Symptoms:  Used to stay up all night but now on a good medication regiment ([place]  and [place] ) with [place] that helps him sleep.  No changes in appetite.  Good energy during the day.  No prolonged sadness or depression.  Occasional low SE.  No hopelessness.  Sudden anxiety/panic.  Triggered by sensitivity to others feelings(other being upset or worried).  Threw up yesterday after finding out about the death of a friend from church.  Fears heights, snakes,and clowns.  No general worry, sometimes the weather.  Social anxiety outside of family.  No intrusive thoughts.  No compulsive behavior.  Makes lists and sticky notes.  No trouble paying attention.  Not distracted or forgetful.  Trouble relating to people outside of family. Good reading others' emotions but overly sensitive.  No close friends outside of family.  No repetitive speech or behavior.  No overly intense and transition.  Struggles with change and transition.  No sensory hypersensitivity.     Risk Assessment: Danger to Self:  No Self-injurious Behavior: No Danger to Others: No Duty to Warn:no Physical Aggression / Violence:No  Access to Firearms  a concern: No  Gang Involvement:No  Patient / guardian was educated about steps to take if suicide or homicide risk level increases between visits: n/a While future psychiatric events cannot be accurately predicted, the patient does not currently require acute inpatient psychiatric care and does not currently meet [place]  involuntary commitment criteria.  Substance Abuse History: Current substance abuse: No   Two years without any alcohol in system.    Past Psychiatric History:   Previous psychological history is significant for anxiety and  learning disability Outpatient Providers:Attended therapy one time during 10th grade for anger management.   History of Psych Hospitalization: No  Psychological Testing: Psych-educational testing only.     Abuse History:  Victim of: No., None   Report needed: No. Victim of Neglect:No. Perpetrator of None  Witness / Exposure to Domestic Violence: No   Protective Services Involvement: No  Witness to [employer] Violence:  No   Family History:  Family History  Problem Relation Age of Onset   Hypertension [place]    Hyperlipidemia [place]    Colon cancer Maternal Grandmother    Hypertension Maternal Grandmother    Emphysema Maternal Grandmother    Hypertension Maternal Grandfather    Alcohol abuse Maternal Grandfather    Cancer Paternal Grandmother    Congestive Heart Failure Paternal Grandfather    Rectal cancer Neg Hx    Esophageal cancer Neg Hx   Anxiety   Living situation: the patient lives with their family (parents).  Sister (57 years lives independently.    Sexual Orientation: Asexual - No interest in dating.    Relationship Status: single  Name of spouse / other:None If a parent, number of children / ages:None  Support Systems: parents and sister   Financial Stress:  No   Income/Employment/Disability: Supported by [place] and [employer].  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    Military Service: No   Educational History: Education: high [place] diploma/GED with special education certificate.  Could not pass the competency test for Math.  Required assistance throughout [place].       Religion/Sprituality/World View: [place] since 1991   Any cultural differences that may affect / interfere with treatment:  None  Recreation/Hobbies: walking, being with cat, family, social media, movies, tv series.    Stressors: Other: None   Stressed with situations that are different or new.    Strengths: conversation, cleaning, helpful  Barriers:  Social  Anxiety  Legal History: Pending legal issue / charges: The patient has no significant history of legal issues. History of legal issue / charges: None  Medical History/Surgical History: reviewed Past Medical History:  Diagnosis Date   Allergy    Cancer (HCC) 12/02/13   seminoma   Chicken pox    Down syndrome    Trisomy 21   Family history of anesthesia complication    [place] has PONV   Gall stones    Gallstones    Hematuria    History of glomerulonephritis nephrologist-  dr douglass   2002 resolved without kidney damage   S/P radiation therapy 07/22/2014 through 08/14/2014                                                      Right hockey stick (periaortic/right iliac lymph nodes) 2550 cGy in 15 sessions, para aortic lymph node  boost 60 cGy in 3 sessions                         Trisomy 21     Past Surgical History:  Procedure Laterality Date   ORCHIECTOMY Right 12/02/2013   Procedure: INGUINAL ORCHIECTOMY;  Surgeon: Garnette CHRISTELLA Shack, MD;  Location[facility];  Service: Urology;  Laterality: Right;   ROOT CANAL      Medications: Current Outpatient Medications  Medication Sig Dispense Refill   [place]  (BUSPAR ) 7.5 MG tablet Take 1 tablet (7.5 mg total) by mouth 2 (two) times daily. 60 tablet 1   Cholecalciferol (VITAMIN D ) 50 MCG (2000 UT) CAPS Take 1 capsule by mouth daily.     fluticasone (FLONASE) 50 MCG/ACT nasal spray Place 1 spray into both nostrils daily.     hydrOXYzine  (ATARAX ) 25 MG tablet Take 1 tablet (25 mg total) by mouth at bedtime as needed. 30 tablet 1   ibuprofen (ADVIL,MOTRIN) 200 MG tablet Take 200 mg by mouth every 6 (six) hours as needed.     [place] 10 MG CHEW Chew 2 tablets by mouth at bedtime. PRN     pantoprazole  ([place] ) 40 MG tablet Take 1 tablet (40 mg total) by mouth daily. 30 tablet 3   No current facility-administered medications for this visit.    Allergies  Allergen Reactions   Codeine Nausea Only   Sulfa  Antibiotics Rash    swelling   Terbinafine  Hcl Rash  No current digestion problems except for nausea when upset.  No concussions, seizures or HI.    Diagnoses:  Social anxiety disorder  Unspecified intellectual disabilities  R/O Mild ID  Plan of Care: Patient presents with a history of excessive anxiety reactions, mostly related to worries when seeing others upset.  The anxiety response is so severe that patient will become nauseous and vomit, coughing up blood at times.  Patient was previously diagnosed with Downs syndrome and received a special education diploma for graduation.  He works one day a week and was reported to have many independent skills, but still lives with his family, in part related to high social anxiety.  He becomes highly anxious interaction with other outside of his family, especially without his parents or sister present.  Regular psychotherapy sessions recommended to help patient learn to manage his anxiety and severe reactions to stress.    Goal: Better manage severe anxiety response.  Objective: Patient to engage in physical calming activities daily  Target date: 04/20/2024 Progress: 0  Objective: Patient to use relaxation techniques when needed during 80% of the instances.   Target date: 04/20/2024 Progress: 0  Objective: Patient to participate in social activity outside of family without excessive distress during 80% of the instances.  Target date: 10/21/2024 Progress: 0  Interventions: Relaxation training, CBT, Positive Behavior Supports      Lin Glazier, PhD

## 2023-10-27 ENCOUNTER — Ambulatory Visit: Admitting: Psychology

## 2023-10-27 ENCOUNTER — Encounter: Payer: Self-pay | Admitting: Psychology

## 2023-10-27 DIAGNOSIS — F4010 Social phobia, unspecified: Secondary | ICD-10-CM | POA: Diagnosis not present

## 2023-10-27 DIAGNOSIS — F79 Unspecified intellectual disabilities: Secondary | ICD-10-CM

## 2023-10-27 NOTE — Progress Notes (Signed)
 [facility] Counselor/Therapist Progress Note  Patient ID: Zachary Wheeler, MRN: [id number],    Date: 10/27/2023  Time Spent: 12:30 - 1:15 pm  Met with patient for therapy session.  Patient was at the clinic and session was conducted from therapist's office in person.  Treatment Type: Individual Therapy  Reported Symptoms: Patient presents with a history of excessive anxiety reactions, mostly related to worries when seeing others upset. The anxiety response is so severe that patient will become nauseous and vomit, coughing up blood at times. Patient was previously diagnosed with Downs syndrome and received a special education diploma for graduation. He works one day a week and was reported to have many independent skills, but still lives with his family, in part related to high social anxiety. He becomes highly anxious interaction with other outside of his family, especially without his parents or sister present.   Mental Status Exam: Appearance:  Neat and Well Groomed     Behavior: Appropriate and Sharing  Motor: Normal  Speech/Language:  Clear and Coherent, Normal Rate, and Monotone  Affect: Constricted and Flat  Mood: euthymic  Thought process: concrete  Thought content:   WNL  Sensory/Perceptual disturbances:   WNL  Orientation: oriented to person, place, time/date, and situation  Attention: Good  Concentration: Good  Memory: WNL  Fund of knowledge:  Fair  Insight:   Fair  Judgment:  Good  Impulse Control: Good   Risk Assessment: Danger to Self:  No Self-injurious Behavior: No Danger to Others: No Duty to Warn:no Physical Aggression / Violence:No  Access to Firearms a concern: No  Gang Involvement:No   Subjective: Patient denied any current concerns focusing more on past problems such as frequent nightmares from medication and being bullied in the past (middle and high school). He indicated enjoying his life now, especially when he can do activities with  his parents.  The time he had a recent problems when when he become overwhelmed when he attended a funeral reception at his church and got stuck in a crowded narrow hallway     Interventions: Cognitive Behavioral Therapy and relaxation training.  Today's session focused on helping patient develop a deep breathing routine that he can use when anxious or overwhelmed. Rapport building and explaining the parameters of therapy was also done.     Diagnosis:Social anxiety disorder  Unspecified intellectual disabilities  Plan: Regular psychotherapy sessions recommended to help patient learn to manage his anxiety and severe reactions to stress.     Goal: Better manage severe anxiety response.   Objective: Patient to engage in physical calming activities daily  Target date: 04/20/2024 Progress: 5   Objective: Patient to use relaxation techniques when needed during 80% of the instances.   Target date: 04/20/2024 Progress: 5   Objective: Patient to participate in social activity outside of family without excessive distress during 80% of the instances.  Target date: 10/21/2024 Progress: 5   Interventions: Relaxation training, CBT, Positive Behavior Supports   Trevaughn Schear, PhD

## 2023-10-27 NOTE — Progress Notes (Signed)
   Zachary Dames, PhD

## 2023-11-17 ENCOUNTER — Ambulatory Visit (INDEPENDENT_AMBULATORY_CARE_PROVIDER_SITE_OTHER): Admitting: Psychology

## 2023-11-17 ENCOUNTER — Encounter: Payer: Self-pay | Admitting: Psychology

## 2023-11-17 DIAGNOSIS — F4010 Social phobia, unspecified: Secondary | ICD-10-CM

## 2023-11-17 DIAGNOSIS — F79 Unspecified intellectual disabilities: Secondary | ICD-10-CM | POA: Diagnosis not present

## 2023-11-17 NOTE — Progress Notes (Signed)
 [facility] Counselor/Therapist Progress Note  Patient ID: Zachary Wheeler, MRN: [id number],    Date: 11/17/2023  Time Spent: 1:30 - 2:15 pm  Met with patient for therapy session.  Patient was at the clinic and session was conducted from therapist's office in person.  Treatment Type: Individual Therapy  Reported Symptoms: Patient presents with a history of excessive anxiety reactions, mostly related to worries when seeing others upset. The anxiety response is so severe that patient will become nauseous and vomit, coughing up blood at times. Patient was previously diagnosed with Downs syndrome and received a special education diploma for graduation. He works one day a week and was reported to have many independent skills, but still lives with his family, in part related to high social anxiety. He becomes highly anxious interaction with other outside of his family, especially without his parents or sister present.  Patient reported feeling much calmer this session.  Mental Status Exam: Appearance:  Neat and Well Groomed     Behavior: Appropriate and Sharing  Motor: Normal  Speech/Language:  Clear and Coherent, Normal Rate, and Monotone  Affect: Constricted and Flat  Mood: euthymic  Thought process: concrete  Thought content:   WNL  Sensory/Perceptual disturbances:   WNL  Orientation: oriented to person, place, time/date, and situation  Attention: Good  Concentration: Good  Memory: WNL  Fund of knowledge:  Fair  Insight:   Fair  Judgment:  Good  Impulse Control: Good   Risk Assessment: Danger to Self:  No Self-injurious Behavior: No Danger to Others: No Duty to Warn:no Physical Aggression / Violence:No  Access to Firearms a concern: No  Gang Involvement:No   Subjective: Patient reported feeling much calmer than previously as he has been practicing the deep breathing and using it when needed.  He mentioned alternating weeks when he works to have more time off  and breaks.  Mother corroborated that patient has been calmer since the last session.     Interventions: Cognitive Behavioral Therapy and relaxation training.  Today's session focused on helping patient recognize his thoughts when he is anxious and questioning whether they are true or likely to happen.  Also discussed was thinking of positive alternatives and seeking help when needed.   Diagnosis:Social anxiety disorder  Unspecified intellectual disabilities  Assessment: Patient's improved mood and ability to calm resulted in mother asking if the time between sessions could be extended.  Plan: Regular psychotherapy sessions recommended to help patient learn to manage his anxiety and severe reactions to stress.  Session frequency to be reduced to 1x per month to see if patient can maintain treatment gains.     Goal: Better manage severe anxiety response.   Objective: Patient to engage in physical calming activities daily  Target date: 04/20/2024 Progress: 25   Objective: Patient to use relaxation techniques when needed during 80% of the instances.   Target date: 04/20/2024 Progress: 25   Objective: Patient to participate in social activity outside of family without excessive distress during 80% of the instances.  Target date: 10/21/2024 Progress: 10   Interventions: Relaxation training, CBT, Positive Behavior Supports   Zachary Oleksy, PhD

## 2023-12-08 ENCOUNTER — Ambulatory Visit: Admitting: Psychology

## 2023-12-08 ENCOUNTER — Encounter: Payer: Self-pay | Admitting: Psychology

## 2023-12-08 DIAGNOSIS — F4010 Social phobia, unspecified: Secondary | ICD-10-CM

## 2023-12-08 DIAGNOSIS — F79 Unspecified intellectual disabilities: Secondary | ICD-10-CM | POA: Diagnosis not present

## 2023-12-08 NOTE — Progress Notes (Signed)
 [facility] Counselor/Therapist Progress Note  Patient ID: Zachary Wheeler, MRN: [id number],    Date: 12/08/2023  Time Spent: 3:00 - 3:45 pm  Met with patient for therapy session.  Patient was at the clinic and session was conducted from therapist's office in person.  Treatment Type: Individual Therapy  Reported Symptoms: Patient presents with a history of excessive anxiety reactions, mostly related to worries when seeing others upset. The anxiety response is so severe that patient will become nauseous and vomit, coughing up blood at times. Patient was previously diagnosed with Downs syndrome and received a special education diploma for graduation. He works one day a week and was reported to have many independent skills, but still lives with his family, in part related to high social anxiety. He becomes highly anxious interaction with other outside of his family, especially without his parents or sister present.  Patient reported feeling in a generally positive mood this session.  Mental Status Exam: Appearance:  Neat and Well Groomed     Behavior: Appropriate and Sharing  Motor: Normal  Speech/Language:  Clear and Coherent, Normal Rate, and Monotone  Affect: Constricted and Flat  Mood: euthymic  Thought process: concrete  Thought content:   WNL  Sensory/Perceptual disturbances:   WNL  Orientation: oriented to person, place, time/date, and situation  Attention: Good  Concentration: Good  Memory: WNL  Fund of knowledge:  Fair  Insight:   Fair  Judgment:  Good  Impulse Control: Good   Risk Assessment: Danger to Self:  No Self-injurious Behavior: No Danger to Others: No Duty to Warn:no Physical Aggression / Violence:No  Access to Firearms a concern: No  Gang Involvement:No   Subjective: Patient reported feeling in a generally positive mood as he has been writing in his journal and participating in enjoyable activities.  He mentioned being distressed when his  father snaps at him and his mother, due to his father returning from rotary club in an irritable mood.  At a social outing, another person referred to patient as too emotionally sensitive which bothered patient.       Interventions: Cognitive Behavioral Therapy and relaxation training.  Today's session focused on acknowledging others statements and beliefs in an objective way, without judging or assuming they are true.   Emphasis was on staying true to his own values and beliefs and being aware enough of his emotions to know when to show them and when to calm.     Diagnosis:Social anxiety disorder  Unspecified intellectual disabilities  Assessment: Patient was able to discuss several concerns but talk about many positive events as well.  He seems to have a good balance of positive and negative thought currently.    Plan: Regular psychotherapy sessions recommended to help patient learn to manage his anxiety and severe reactions to stress.  Session frequency to be reduced to 1x per month to see if patient can maintain treatment gains.     Goal: Better manage severe anxiety response.   Objective: Patient to engage in physical calming activities daily  Target date: 04/20/2024 Progress: 35   Objective: Patient to use relaxation techniques when needed during 80% of the instances.   Target date: 04/20/2024 Progress: 35   Objective: Patient to participate in social activity outside of family without excessive distress during 80% of the instances.  Target date: 10/21/2024 Progress: 15   Interventions: Relaxation training, CBT, Positive Behavior Supports   Terree Gaultney, PhD  Jovaughn Wojtaszek, PhD

## 2023-12-13 ENCOUNTER — Encounter (HOSPITAL_COMMUNITY): Payer: Self-pay

## 2023-12-13 ENCOUNTER — Ambulatory Visit (HOSPITAL_COMMUNITY)
Admission: RE | Admit: 2023-12-13 | Discharge: 2023-12-13 | Disposition: A | Discharge status: Home / Self Care | Source: Ambulatory Visit | Attending: Family Medicine | Admitting: Family Medicine

## 2023-12-13 ENCOUNTER — Other Ambulatory Visit: Payer: Self-pay

## 2023-12-13 VITALS — BP 105/66 | HR 99 | Temp 99.5°F | Resp 18

## 2023-12-13 DIAGNOSIS — R21 Rash and other nonspecific skin eruption: Secondary | ICD-10-CM | POA: Diagnosis not present

## 2023-12-13 MED ORDER — METHYLPREDNISOLONE 4 MG PO TBPK: ORAL_TABLET | ORAL | 0 refills | Status: AC

## 2023-12-13 NOTE — ED Triage Notes (Addendum)
 Complains of swelling in knees, elbows, ankles, and arms.  Patient has splotchy areas of redness to his skin.  Areas did burn, but no itching ankles are swollen  This started last Tuesday ( a week ago).   Has had claritin and flonase, and advil and cortizone 10.  Has been distributing pine straw, has never had this response to anything    Reports canker sores last week, had used vapor cool cough drops prior to this  Family member questions if ant bites

## 2023-12-13 NOTE — ED Provider Notes (Signed)
 [facility]   [id number] 12/13/23 Arrival Time: 1421   History              Chief Complaint     Chief Complaint  Patient presents with   Joint Pain      swelling in knees, elbows, ankles, arms - Entered by patient   Appointment      2:30      HPI Zachary Wheeler is a 48 y.o. male.   Pt has a hx of Down Syndrome and trisomy 21. Last Tuesday (10/15) he was sitting in some pine straw when he experienced a sudden stinging and burning pain in bilateral knees with some erythema. The next day he noticed swelling to bilateral distal upper and lower extremities with erythema. The swelling was from the elbows down to the wrists and from the knees to the ankles. He has pain in his ankle joints with ambulation. The erythema has been splotchy in appearance and unchanged. The areas have been warm to the touch and tender. He denies pruritus, fevers, or chills. Pt does remember crawling under a bush on his forearms and knees prior to the symptoms appearing.  The pine straw was newly laid 3 weeks prior.   The history is provided by the patient.         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 without kidney damage   S/P radiation therapy 07/22/2014 through 08/14/2014                                                       Right hockey stick (periaortic/right iliac lymph nodes) 2550 cGy in 15 sessions, para aortic lymph node boost 60 cGy in 3 sessions                         Trisomy 21                Patient Active Problem List    Diagnosis Date Noted   Hematemesis with nausea 09/28/2023   Situational anxiety 12/14/2022   Allergic rhinitis 09/29/2015   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 Authorizing Provider  Cholecalciferol (VITAMIN D ) 50 MCG (  2000 UT) CAPS Take 1 capsule by mouth daily.       [provider]  fluticasone (FLONASE) 50 MCG/ACT nasal spray Place 1 spray into both nostrils daily.       [provider]  hydrOXYzine  (ATARAX ) 25 MG tablet Take 1 tablet (25 mg total) by mouth at bedtime as needed. 04/26/23     Sagardia, Miguel Jose, MD  ibuprofen (ADVIL,MOTRIN) 200 MG tablet Take 200 mg by mouth every 6 (six) hours as needed.       [provider]  Melatonin 10 MG CHEW Chew 2 tablets by mouth at bedtime. PRN       [provider]  pantoprazole  (PROTONIX ) 40 MG tablet Take 1 tablet (40 mg total) by mouth daily. 09/28/23     Purcell Emil Schanz, MD      Family History      Family History  Problem Relation Age of Onset   Hypertension Mother     Hyperlipidemia Mother     Colon cancer Maternal Grandmother     Hypertension Maternal Grandmother     Emphysema Maternal Grandmother     Hypertension Maternal Grandfather     Alcohol abuse Maternal Grandfather     Cancer Paternal Grandmother     Congestive Heart Failure Paternal Grandfather     Rectal cancer Neg Hx     Esophageal cancer Neg Hx            Social History Social History  Social History        Tobacco Use   Smoking status: Never   Smokeless tobacco: Never  Vaping Use   Vaping status: Never Used  Substance Use Topics   Alcohol use: Not Currently   Drug use: No          Allergies              Codeine, Sulfa antibiotics, and  Terbinafine  hcl     Review of Systems Review of Systems  Constitutional:  Negative for chills, fatigue and fever.  HENT:  Negative for sore throat.   Eyes:  Negative for discharge and itching.  Respiratory:  Negative for cough and shortness of breath.   Cardiovascular:  Negative for chest pain.  Gastrointestinal:  Negative for abdominal pain, diarrhea, nausea and vomiting.  Genitourinary:  Negative for difficulty urinating.  Musculoskeletal:  Positive for arthralgias (ankles). Negative for myalgias.  Skin:  Positive for rash.  Neurological:  Negative for headaches.       Physical Exam Updated Vital Signs BP 105/66 (BP Location: Right Arm)   Pulse 99   Temp 99.5 F (37.5 C) (Oral)   Resp 18   SpO2 96%    Physical Exam Constitutional:      Appearance: Normal appearance.  HENT:     Mouth/Throat:     Mouth: Mucous membranes are moist.     Pharynx: Oropharynx is clear.  Cardiovascular:     Rate and Rhythm: Normal rate and regular rhythm.     Pulses: Normal pulses.          Radial pulses are 2+ on the right side and 2+ on the left side.       Dorsalis pedis pulses are 2+ on the right side and 2+ on the left side.       Posterior tibial pulses are 2+ on the right side and 2+ on the left side.     Heart sounds: Normal heart sounds.  Pulmonary:     Effort:  Pulmonary effort is normal.     Breath sounds: Normal breath sounds.  Musculoskeletal:     Cervical back: Neck supple.  Lymphadenopathy:     Cervical: No cervical adenopathy.  Skin:    Findings: Erythema and rash present.         Comments: Large erythematous and shiny ill-defined areas on bilateral forearms and elbows and bilateral knees and shins. Areas are diffusely swollen. No papules, vesicles, or wheals present. Area is tender and warm to the touch. Erythema blanches. The area of the rash is isolated to previously skin exposed area.   Neurological:     Mental Status: He is alert.         ED Treatments / Results   Labs (all labs ordered are listed, but only abnormal results are displayed) Labs Reviewed - No data to display   EKG   Radiology Imaging Results (Last 48 hours)  No results found.     Procedures Procedures (including critical care time)   Medications Ordered in ED Medications - No data to display     Initial Impression / Assessment and Plan / ED Course  I have reviewed the triage vital signs and the nursing notes.   Pertinent labs & imaging results that were available during my care of the patient were reviewed by me and considered in my medical decision making (see chart for details).     Nonspecific Skin Eruption   Uncertain to the etiology of the rash. Given the only affected areas are exposed skin areas that came in to contact with the ground it is possibly a contact dermatitis. Consideration also given to cellulitis, however the distribution makes this less likely. Will trial a course of steroids with a Medrol Dosepak. Pt encouraged to follow up with PCP next week for re-evaluation. Red flag symptoms reviewed and return precautions given. No signs of bacterial skin infection.     Final Clinical Impressions(s) / ED Diagnoses    Final diagnoses:  1. Rash and nonspecific skin eruption      Meds ordered this encounter  Medications   methylPREDNISolone (MEDROL DOSEPAK) 4 MG TBPK tablet    Sig: Take as directed.    Dispense:  1 each    Refill:  0          Rolinda Rogue, MD 12/13/23 1755

## 2023-12-13 NOTE — Medical Student Note (Signed)
 [facility] Insurance account manager Note For educational purposes for Medical, PA and NP students only and not part of the legal medical record.   CSN: [id number] Arrival date & time: 12/13/23  1421      History   Chief Complaint Chief Complaint  Patient presents with   Joint Pain    swelling in knees, elbows, ankles, arms - Entered by patient   Appointment    2:30    HPI Zachary Wheeler is a 48 y.o. male.  Pt has a hx of Down Syndrome and trisomy 21. Last Tuesday (10/15) he was sitting in some pine straw when he experienced a sudden stinging and burning pain in bilateral knees with some erythema. The next day he noticed swelling to bilateral distal upper and lower extremities with erythema. The swelling was from the elbows down to the wrists and from the knees to the ankles. He has pain in his ankle joints with ambulation. The erythema has been splotchy in appearance and unchanged. The areas have been warm to the touch and tender. He denies pruritus, fevers, or chills. Pt does remember crawling under a bush on his forearms and knees prior to the symptoms appearing.  The pine straw was newly laid 3 weeks prior.   The history is provided by the patient.    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 without kidney damage   S/P radiation therapy 07/22/2014 through 08/14/2014                                                      Right hockey stick (periaortic/right iliac lymph nodes) 2550 cGy in 15 sessions, para aortic lymph node boost 60 cGy in 3 sessions                         Trisomy 21     Patient Active Problem List   Diagnosis Date Noted   Hematemesis with nausea 09/28/2023   Situational anxiety  12/14/2022   Allergic rhinitis 09/29/2015   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 Authorizing Provider  Cholecalciferol (VITAMIN D ) 50 MCG (2000 UT) CAPS Take 1 capsule by mouth daily.    [provider]  fluticasone (FLONASE) 50 MCG/ACT nasal spray Place 1 spray into both nostrils daily.    [provider]  hydrOXYzine  (ATARAX ) 25 MG tablet Take 1 tablet (25 mg total) by mouth at bedtime as needed. 04/26/23   Sagardia, Miguel Jose, MD  ibuprofen (  ADVIL,MOTRIN) 200 MG tablet Take 200 mg by mouth every 6 (six) hours as needed.    [provider]  Melatonin 10 MG CHEW Chew 2 tablets by mouth at bedtime. PRN    [provider]  pantoprazole  (PROTONIX ) 40 MG tablet Take 1 tablet (40 mg total) by mouth daily. 09/28/23   Purcell Emil Schanz, MD    Family History Family History  Problem Relation Age of Onset   Hypertension Mother    Hyperlipidemia Mother    Colon cancer Maternal Grandmother    Hypertension Maternal Grandmother    Emphysema Maternal Grandmother    Hypertension Maternal Grandfather    Alcohol abuse Maternal Grandfather    Cancer Paternal Grandmother    Congestive Heart Failure Paternal Grandfather    Rectal cancer Neg Hx    Esophageal cancer Neg Hx     Social History Social History   Tobacco Use   Smoking status: Never   Smokeless tobacco: Never  Vaping Use   Vaping status: Never Used  Substance Use Topics   Alcohol use: Not Currently   Drug use: No     Allergies   Codeine, Sulfa antibiotics, and Terbinafine  hcl   Review of  Systems Review of Systems  Constitutional:  Negative for chills, fatigue and fever.  HENT:  Negative for sore throat.   Eyes:  Negative for discharge and itching.  Respiratory:  Negative for cough and shortness of breath.   Cardiovascular:  Negative for chest pain.  Gastrointestinal:  Negative for abdominal pain, diarrhea, nausea and vomiting.  Genitourinary:  Negative for difficulty urinating.  Musculoskeletal:  Positive for arthralgias (ankles). Negative for myalgias.  Skin:  Positive for rash.  Neurological:  Negative for headaches.     Physical Exam Updated Vital Signs BP 105/66 (BP Location: Right Arm)   Pulse 99   Temp 99.5 F (37.5 C) (Oral)   Resp 18   SpO2 96%   Physical Exam Constitutional:      Appearance: Normal appearance.  HENT:     Mouth/Throat:     Mouth: Mucous membranes are moist.     Pharynx: Oropharynx is clear.  Cardiovascular:     Rate and Rhythm: Normal rate and regular rhythm.     Pulses: Normal pulses.          Radial pulses are 2+ on the right side and 2+ on the left side.       Dorsalis pedis pulses are 2+ on the right side and 2+ on the left side.       Posterior tibial pulses are 2+ on the right side and 2+ on the left side.     Heart sounds: Normal heart sounds.  Pulmonary:     Effort: Pulmonary effort is normal.     Breath sounds: Normal breath sounds.  Musculoskeletal:     Cervical back: Neck supple.  Lymphadenopathy:     Cervical: No cervical adenopathy.  Skin:    Findings: Erythema and rash present.         Comments: Large erythremic and shiny ill-defined areas on bilateral forearms and elbows and bilateral knees and shins. Areas are diffusely swollen. No papules, vesicles, or wheals present. Area is tender and warm to the touch. Erythema blanches. The area of the rash is isolated to previously skin exposed area.   Neurological:     Mental Status: He is alert.      ED Treatments / Results  Labs (all labs ordered are listed,  but only  abnormal results are displayed) Labs Reviewed - No data to display  EKG  Radiology No results found.  Procedures Procedures (including critical care time)  Medications Ordered in ED Medications - No data to display   Initial Impression / Assessment and Plan / ED Course  I have reviewed the triage vital signs and the nursing notes.  Pertinent labs & imaging results that were available during my care of the patient were reviewed by me and considered in my medical decision making (see chart for details).     Nonspecific Skin Eruption  Uncertain to the etiology of the rash. Given the only affected areas are exposed skin areas that came in to contact with the ground it is possibly a contact dermatitis. Consideration also given to cellulitis, however the distribution makes this less likely. Will trial a course of steroids with a Medrol Dosepak. Pt encouraged to follow up with PCP next week for re-evaluation. Red flag symptoms reviewed and return precautions given.    Final Clinical Impressions(s) / ED Diagnoses   Final diagnoses:  None    New Prescriptions New Prescriptions   No medications on file

## 2024-01-16 ENCOUNTER — Encounter: Payer: Self-pay | Admitting: Psychology

## 2024-01-16 ENCOUNTER — Ambulatory Visit: Admitting: Psychology

## 2024-01-16 DIAGNOSIS — F79 Unspecified intellectual disabilities: Secondary | ICD-10-CM | POA: Diagnosis not present

## 2024-01-16 DIAGNOSIS — F4010 Social phobia, unspecified: Secondary | ICD-10-CM | POA: Diagnosis not present

## 2024-01-16 NOTE — Progress Notes (Signed)
 [facility] Counselor/Therapist Progress Note  Patient ID: Zachary Wheeler, MRN: [id number],    Date: 01/16/2024  Time Spent: 10:30 - 11:15 am  Met with patient for therapy session.  Patient was at the clinic and session was conducted from therapist's office in person.  Treatment Type: Individual Therapy  Reported Symptoms: Patient presents with a history of excessive anxiety reactions, mostly related to worries when seeing others upset. The anxiety response is so severe that patient will become nauseous and vomit, coughing up blood at times. Patient was previously diagnosed with Downs syndrome and received a special education diploma for graduation. He works one day a week and was reported to have many independent skills, but still lives with his family, in part related to high social anxiety. He becomes highly anxious interaction with other outside of his family, especially without his parents or sister present.  Patient reported feeling in a generally positive mood this session.  Mental Status Exam: Appearance:  Neat and Well Groomed     Behavior: Appropriate and Sharing  Motor: Normal  Speech/Language:  Clear and Coherent, Normal Rate, and Monotone  Affect: Constricted and Flat  Mood: euthymic  Thought process: concrete  Thought content:   WNL  Sensory/Perceptual disturbances:   WNL  Orientation: oriented to person, place, time/date, and situation  Attention: Good  Concentration: Good  Memory: WNL  Fund of knowledge:  Fair  Insight:   Fair  Judgment:  Good  Impulse Control: Good   Risk Assessment: Danger to Self:  No Self-injurious Behavior: No Danger to Others: No Duty to Warn:no Physical Aggression / Violence:No  Access to Firearms a concern: No  Gang Involvement:No   Subjective: Patient reported feeling in a generally positive mood,although he mentioned over-reacting emotionally when his mother and sister wanted to spontaneously go on a trip.  He  also indicted being emotionally sensitive and anxious in general and becoming upset when his parents argue.    Interventions: Cognitive Behavioral Therapy and relaxation training.  Today's session focused on calming and noticing what is making him anxious or upset before responding.  Emphasis was on discussing his concerns in a calm way rather than immediately reacting.     Diagnosis:Social anxiety disorder  Unspecified intellectual disabilities  Assessment: Patient has been engaged in sessions and reported benefiting from them.      Plan: Regular psychotherapy sessions recommended to help patient learn to manage his anxiety and severe reactions to stress.  Session frequency to be reduced to 1x per month to see if patient can maintain treatment gains.     Goal: Better manage severe anxiety response.   Objective: Patient to engage in physical calming activities daily  Target date: 04/20/2024 Progress: 45   Objective: Patient to use relaxation techniques when needed during 80% of the instances.   Target date: 04/20/2024 Progress: 45   Objective: Patient to participate in social activity outside of family without excessive distress during 80% of the instances.  Target date: 10/21/2024 Progress: 20   Interventions: Relaxation training, CBT, Positive Behavior Supports   Francee Setzer, PhD                                                Sandrea Boer, PhD

## 2024-04-29 ENCOUNTER — Encounter: Admitting: Emergency Medicine
